# Patient Record
Sex: Male | Born: 1950 | Race: White | Hispanic: No | Marital: Married | Smoking: Current every day smoker
Health system: Southern US, Community
[De-identification: ages and names within clinical notes are randomized; demographics above are authoritative.]

## PROBLEM LIST (undated history)

## (undated) DIAGNOSIS — I10 Essential (primary) hypertension: Secondary | ICD-10-CM

---

## 2019-04-13 ENCOUNTER — Other Ambulatory Visit: Payer: Self-pay

## 2019-04-13 DIAGNOSIS — Z20822 Contact with and (suspected) exposure to covid-19: Secondary | ICD-10-CM

## 2019-04-17 LAB — NOVEL CORONAVIRUS, NAA: SARS-CoV-2, NAA: NOT DETECTED

## 2020-04-06 ENCOUNTER — Other Ambulatory Visit: Payer: Self-pay

## 2020-04-06 ENCOUNTER — Inpatient Hospital Stay: Payer: Medicare Other

## 2020-04-06 ENCOUNTER — Inpatient Hospital Stay
Admission: EM | Admit: 2020-04-06 | Discharge: 2020-04-15 | DRG: 207 | Disposition: A | Discharge status: Skilled Nursing Facility | Payer: Medicare Other | Attending: Internal Medicine | Admitting: Internal Medicine

## 2020-04-06 ENCOUNTER — Emergency Department: Payer: Medicare Other

## 2020-04-06 DIAGNOSIS — R29705 NIHSS score 5: Secondary | ICD-10-CM | POA: Diagnosis not present

## 2020-04-06 DIAGNOSIS — N179 Acute kidney failure, unspecified: Secondary | ICD-10-CM | POA: Diagnosis present

## 2020-04-06 DIAGNOSIS — R4182 Altered mental status, unspecified: Secondary | ICD-10-CM | POA: Diagnosis present

## 2020-04-06 DIAGNOSIS — R4789 Other speech disturbances: Secondary | ICD-10-CM | POA: Diagnosis present

## 2020-04-06 DIAGNOSIS — J441 Chronic obstructive pulmonary disease with (acute) exacerbation: Secondary | ICD-10-CM | POA: Diagnosis present

## 2020-04-06 DIAGNOSIS — F17210 Nicotine dependence, cigarettes, uncomplicated: Secondary | ICD-10-CM | POA: Diagnosis present

## 2020-04-06 DIAGNOSIS — Z20822 Contact with and (suspected) exposure to covid-19: Secondary | ICD-10-CM | POA: Diagnosis present

## 2020-04-06 DIAGNOSIS — E872 Acidosis: Secondary | ICD-10-CM | POA: Diagnosis present

## 2020-04-06 DIAGNOSIS — I214 Non-ST elevation (NSTEMI) myocardial infarction: Secondary | ICD-10-CM | POA: Diagnosis present

## 2020-04-06 DIAGNOSIS — R29738 NIHSS score 38: Secondary | ICD-10-CM | POA: Diagnosis not present

## 2020-04-06 DIAGNOSIS — R29731 NIHSS score 31: Secondary | ICD-10-CM | POA: Diagnosis not present

## 2020-04-06 DIAGNOSIS — I10 Essential (primary) hypertension: Secondary | ICD-10-CM

## 2020-04-06 DIAGNOSIS — T502X5A Adverse effect of carbonic-anhydrase inhibitors, benzothiadiazides and other diuretics, initial encounter: Secondary | ICD-10-CM | POA: Diagnosis present

## 2020-04-06 DIAGNOSIS — I5043 Acute on chronic combined systolic (congestive) and diastolic (congestive) heart failure: Secondary | ICD-10-CM | POA: Diagnosis present

## 2020-04-06 DIAGNOSIS — I5031 Acute diastolic (congestive) heart failure: Secondary | ICD-10-CM | POA: Diagnosis not present

## 2020-04-06 DIAGNOSIS — J9600 Acute respiratory failure, unspecified whether with hypoxia or hypercapnia: Secondary | ICD-10-CM

## 2020-04-06 DIAGNOSIS — J9601 Acute respiratory failure with hypoxia: Principal | ICD-10-CM

## 2020-04-06 DIAGNOSIS — E785 Hyperlipidemia, unspecified: Secondary | ICD-10-CM | POA: Diagnosis present

## 2020-04-06 DIAGNOSIS — I739 Peripheral vascular disease, unspecified: Secondary | ICD-10-CM | POA: Diagnosis present

## 2020-04-06 DIAGNOSIS — I5021 Acute systolic (congestive) heart failure: Secondary | ICD-10-CM | POA: Diagnosis not present

## 2020-04-06 DIAGNOSIS — Z66 Do not resuscitate: Secondary | ICD-10-CM | POA: Diagnosis not present

## 2020-04-06 DIAGNOSIS — R609 Edema, unspecified: Secondary | ICD-10-CM

## 2020-04-06 DIAGNOSIS — I959 Hypotension, unspecified: Secondary | ICD-10-CM | POA: Diagnosis present

## 2020-04-06 DIAGNOSIS — I509 Heart failure, unspecified: Secondary | ICD-10-CM | POA: Diagnosis not present

## 2020-04-06 DIAGNOSIS — I110 Hypertensive heart disease with heart failure: Secondary | ICD-10-CM | POA: Diagnosis present

## 2020-04-06 DIAGNOSIS — I639 Cerebral infarction, unspecified: Secondary | ICD-10-CM | POA: Diagnosis present

## 2020-04-06 DIAGNOSIS — R479 Unspecified speech disturbances: Secondary | ICD-10-CM | POA: Diagnosis not present

## 2020-04-06 DIAGNOSIS — J810 Acute pulmonary edema: Secondary | ICD-10-CM | POA: Diagnosis not present

## 2020-04-06 DIAGNOSIS — E669 Obesity, unspecified: Secondary | ICD-10-CM | POA: Diagnosis present

## 2020-04-06 DIAGNOSIS — R29700 NIHSS score 0: Secondary | ICD-10-CM | POA: Diagnosis present

## 2020-04-06 DIAGNOSIS — F17200 Nicotine dependence, unspecified, uncomplicated: Secondary | ICD-10-CM

## 2020-04-06 DIAGNOSIS — G9341 Metabolic encephalopathy: Secondary | ICD-10-CM | POA: Diagnosis present

## 2020-04-06 DIAGNOSIS — Z6829 Body mass index (BMI) 29.0-29.9, adult: Secondary | ICD-10-CM | POA: Diagnosis not present

## 2020-04-06 DIAGNOSIS — R29717 NIHSS score 17: Secondary | ICD-10-CM | POA: Diagnosis not present

## 2020-04-06 DIAGNOSIS — J9602 Acute respiratory failure with hypercapnia: Secondary | ICD-10-CM | POA: Diagnosis present

## 2020-04-06 DIAGNOSIS — G4733 Obstructive sleep apnea (adult) (pediatric): Secondary | ICD-10-CM | POA: Diagnosis present

## 2020-04-06 HISTORY — DX: Essential (primary) hypertension: I10

## 2020-04-06 LAB — TROPONIN I (HIGH SENSITIVITY)
Troponin I (High Sensitivity): 899 ng/L (ref <18)
Troponin I (High Sensitivity): 833 ng/L (ref <18)

## 2020-04-06 LAB — SARS CORONAVIRUS 2 BY RT PCR (HOSPITAL ORDER, PERFORMED IN ~~LOC~~ HOSPITAL LAB): SARS Coronavirus 2: NEGATIVE

## 2020-04-06 LAB — LACTIC ACID, PLASMA: Lactic Acid, Venous: 1.6 mmol/L (ref 0.5–1.9)

## 2020-04-06 LAB — CBC WITH DIFFERENTIAL/PLATELET
Abs Immature Granulocytes: 0.06 10*3/uL (ref 0.00–0.07)
Basophils Absolute: 0 10*3/uL (ref 0.0–0.1)
Basophils Relative: 0 %
Eosinophils Absolute: 0.4 10*3/uL (ref 0.0–0.5)
Eosinophils Relative: 4 %
HCT: 54.8 % — ABNORMAL HIGH (ref 39.0–52.0)
Hemoglobin: 17.3 g/dL — ABNORMAL HIGH (ref 13.0–17.0)
Immature Granulocytes: 1 %
Lymphocytes Relative: 5 %
Lymphs Abs: 0.5 10*3/uL — ABNORMAL LOW (ref 0.7–4.0)
MCH: 30 pg (ref 26.0–34.0)
MCHC: 31.6 g/dL (ref 30.0–36.0)
MCV: 95 fL (ref 80.0–100.0)
Monocytes Absolute: 1.2 10*3/uL — ABNORMAL HIGH (ref 0.1–1.0)
Monocytes Relative: 11 %
Neutro Abs: 8.4 10*3/uL — ABNORMAL HIGH (ref 1.7–7.7)
Neutrophils Relative %: 79 %
Platelets: 224 10*3/uL (ref 150–400)
RBC: 5.77 MIL/uL (ref 4.22–5.81)
RDW: 12.9 % (ref 11.5–15.5)
WBC: 10.6 10*3/uL — ABNORMAL HIGH (ref 4.0–10.5)
nRBC: 0.2 % (ref 0.0–0.2)

## 2020-04-06 LAB — BLOOD GAS, ARTERIAL
Acid-Base Excess: 0.9 mmol/L (ref 0.0–2.0)
Acid-base deficit: 1.4 mmol/L (ref 0.0–2.0)
Acid-base deficit: 1.3 mmol/L (ref 0.0–2.0)
Allens test (pass/fail): POSITIVE — AB
Bicarbonate: 30.7 mmol/L — ABNORMAL HIGH (ref 20.0–28.0)
Bicarbonate: 34.1 mmol/L — ABNORMAL HIGH (ref 20.0–28.0)
Bicarbonate: 27.6 mmol/L (ref 20.0–28.0)
Delivery systems: POSITIVE
Delivery systems: POSITIVE
Expiratory PAP: 6
Expiratory PAP: 7
FIO2: 30
FIO2: 0.6
FIO2: 0.5
Inspiratory PAP: 12
Inspiratory PAP: 18
MECHVT: 500 mL
Mechanical Rate: 20
Mechanical Rate: 24
O2 Saturation: 87.2 %
O2 Saturation: 99 %
O2 Saturation: 98.7 %
PEEP: 5 cmH2O
Patient temperature: 37
Patient temperature: 37
Patient temperature: 37
RATE: 20 resp/min
RATE: 24 resp/min
pCO2 arterial: 86 mmHg (ref 32.0–48.0)
pCO2 arterial: 98 mmHg (ref 32.0–48.0)
pCO2 arterial: 63 mmHg — ABNORMAL HIGH (ref 32.0–48.0)
pH, Arterial: 7.16 — CL (ref 7.350–7.450)
pH, Arterial: 7.15 — CL (ref 7.350–7.450)
pH, Arterial: 7.25 — ABNORMAL LOW (ref 7.350–7.450)
pO2, Arterial: 68 mmHg — ABNORMAL LOW (ref 83.0–108.0)
pO2, Arterial: 160 mmHg — ABNORMAL HIGH (ref 83.0–108.0)
pO2, Arterial: 138 mmHg — ABNORMAL HIGH (ref 83.0–108.0)

## 2020-04-06 LAB — COMPREHENSIVE METABOLIC PANEL
ALT: 132 U/L — ABNORMAL HIGH (ref 0–44)
AST: 109 U/L — ABNORMAL HIGH (ref 15–41)
Albumin: 3.7 g/dL (ref 3.5–5.0)
Alkaline Phosphatase: 80 U/L (ref 38–126)
Anion gap: 14 (ref 5–15)
BUN: 63 mg/dL — ABNORMAL HIGH (ref 8–23)
CO2: 26 mmol/L (ref 22–32)
Calcium: 8.8 mg/dL — ABNORMAL LOW (ref 8.9–10.3)
Chloride: 93 mmol/L — ABNORMAL LOW (ref 98–111)
Creatinine, Ser: 2.22 mg/dL — ABNORMAL HIGH (ref 0.61–1.24)
GFR calc Af Amer: 34 mL/min — ABNORMAL LOW (ref >60)
GFR calc non Af Amer: 29 mL/min — ABNORMAL LOW (ref >60)
Glucose, Bld: 129 mg/dL — ABNORMAL HIGH (ref 70–99)
Potassium: 4.9 mmol/L (ref 3.5–5.1)
Sodium: 133 mmol/L — ABNORMAL LOW (ref 135–145)
Total Bilirubin: 0.5 mg/dL (ref 0.3–1.2)
Total Protein: 7.4 g/dL (ref 6.5–8.1)

## 2020-04-06 LAB — BLOOD GAS, VENOUS
Acid-base deficit: 0.9 mmol/L (ref 0.0–2.0)
Bicarbonate: 32 mmol/L — ABNORMAL HIGH (ref 20.0–28.0)
O2 Saturation: 88.9 %
Patient temperature: 37
pCO2, Ven: 94 mmHg (ref 44.0–60.0)
pH, Ven: 7.14 — CL (ref 7.250–7.430)
pO2, Ven: 73 mmHg — ABNORMAL HIGH (ref 32.0–45.0)

## 2020-04-06 LAB — URINALYSIS, COMPLETE (UACMP) WITH MICROSCOPIC
Bacteria, UA: NONE SEEN
Bilirubin Urine: NEGATIVE
Glucose, UA: NEGATIVE mg/dL
Hgb urine dipstick: NEGATIVE
Ketones, ur: NEGATIVE mg/dL
Leukocytes,Ua: NEGATIVE
Nitrite: NEGATIVE
Protein, ur: NEGATIVE mg/dL
Specific Gravity, Urine: 1.008 (ref 1.005–1.030)
pH: 5 (ref 5.0–8.0)

## 2020-04-06 LAB — PROCALCITONIN: Procalcitonin: 0.62 ng/mL

## 2020-04-06 LAB — BRAIN NATRIURETIC PEPTIDE: B Natriuretic Peptide: 1085.2 pg/mL — ABNORMAL HIGH (ref 0.0–100.0)

## 2020-04-06 MED ORDER — ETOMIDATE 2 MG/ML IV SOLN: 20.0000 mg | Freq: Once | INTRAVENOUS | Status: DC

## 2020-04-06 MED ORDER — FENTANYL CITRATE (PF) 100 MCG/2ML IJ SOLN
25.0000 ug | Freq: Once | INTRAMUSCULAR | Status: AC
  Administered 2020-04-06: 25 ug via INTRAVENOUS

## 2020-04-06 MED ORDER — IPRATROPIUM-ALBUTEROL 0.5-2.5 (3) MG/3ML IN SOLN: 3.0000 mL | Freq: Once | RESPIRATORY_TRACT | Status: DC

## 2020-04-06 MED ORDER — SODIUM CHLORIDE 0.9 % IV SOLN
500.0000 mg | INTRAVENOUS | Status: AC
  Administered 2020-04-06 – 2020-04-10 (×5): 500 mg via INTRAVENOUS
  Filled 2020-04-06 (×5): qty 500

## 2020-04-06 MED ORDER — MIDAZOLAM HCL 2 MG/2ML IJ SOLN: 1.0000 mg | INTRAMUSCULAR | Status: DC | PRN

## 2020-04-06 MED ORDER — PROPOFOL 1000 MG/100ML IV EMUL: 5.0000 ug/kg/min | INTRAVENOUS | Status: DC

## 2020-04-06 MED ORDER — IPRATROPIUM-ALBUTEROL 0.5-2.5 (3) MG/3ML IN SOLN: 3.0000 mL | Freq: Four times a day (QID) | RESPIRATORY_TRACT | Status: DC | PRN

## 2020-04-06 MED ORDER — SODIUM CHLORIDE 0.9 % IV BOLUS
1000.0000 mL | Freq: Once | INTRAVENOUS | Status: AC
  Administered 2020-04-06: 1000 mL via INTRAVENOUS

## 2020-04-06 MED ORDER — ATORVASTATIN CALCIUM 20 MG PO TABS: 80.0000 mg | ORAL_TABLET | Freq: Every day | ORAL | Status: DC

## 2020-04-06 MED ORDER — FENTANYL 2500MCG IN NS 250ML (10MCG/ML) PREMIX INFUSION
25.0000 ug/h | INTRAVENOUS | Status: DC
  Administered 2020-04-06: 25 ug/h via INTRAVENOUS
  Filled 2020-04-06: qty 250

## 2020-04-06 MED ORDER — METHYLPREDNISOLONE SODIUM SUCC 125 MG IJ SOLR: 125.0000 mg | Freq: Once | INTRAMUSCULAR | Status: DC

## 2020-04-06 MED ORDER — NOREPINEPHRINE 4 MG/250ML-% IV SOLN
INTRAVENOUS | Status: AC
  Administered 2020-04-06: 5 ug/min via INTRAVENOUS
  Filled 2020-04-06: qty 250

## 2020-04-06 MED ORDER — BUDESONIDE 0.5 MG/2ML IN SUSP
0.5000 mg | Freq: Two times a day (BID) | RESPIRATORY_TRACT | Status: DC
  Administered 2020-04-07 – 2020-04-15 (×16): 0.5 mg via RESPIRATORY_TRACT
  Filled 2020-04-06 (×18): qty 2

## 2020-04-06 MED ORDER — SODIUM CHLORIDE 0.9 % IV SOLN
2.0000 g | INTRAVENOUS | Status: AC
  Administered 2020-04-06 – 2020-04-10 (×5): 2 g via INTRAVENOUS
  Filled 2020-04-06: qty 2
  Filled 2020-04-06: qty 20
  Filled 2020-04-06 (×3): qty 2

## 2020-04-06 MED ORDER — SENNOSIDES-DOCUSATE SODIUM 8.6-50 MG PO TABS: 1.0000 | ORAL_TABLET | Freq: Every evening | ORAL | Status: DC | PRN

## 2020-04-06 MED ORDER — METHYLPREDNISOLONE SODIUM SUCC 40 MG IJ SOLR
40.0000 mg | Freq: Two times a day (BID) | INTRAMUSCULAR | Status: DC
  Administered 2020-04-07 – 2020-04-08 (×3): 40 mg via INTRAVENOUS
  Filled 2020-04-06 (×3): qty 1

## 2020-04-06 MED ORDER — IPRATROPIUM-ALBUTEROL 0.5-2.5 (3) MG/3ML IN SOLN
3.0000 mL | Freq: Once | RESPIRATORY_TRACT | Status: AC
  Administered 2020-04-06: 3 mL via RESPIRATORY_TRACT

## 2020-04-06 MED ORDER — NOREPINEPHRINE 4 MG/250ML-% IV SOLN
0.0000 ug/min | INTRAVENOUS | Status: DC
  Administered 2020-04-07: 5 ug/min via INTRAVENOUS
  Administered 2020-04-07 – 2020-04-08 (×2): 10 ug/min via INTRAVENOUS
  Filled 2020-04-06 (×3): qty 250

## 2020-04-06 MED ORDER — DOCUSATE SODIUM 50 MG/5ML PO LIQD
100.0000 mg | Freq: Two times a day (BID) | ORAL | Status: DC
  Administered 2020-04-07 – 2020-04-15 (×10): 100 mg
  Filled 2020-04-06 (×17): qty 10

## 2020-04-06 MED ORDER — LABETALOL HCL 5 MG/ML IV SOLN: 10.0000 mg | INTRAVENOUS | Status: DC | PRN

## 2020-04-06 MED ORDER — DOCUSATE SODIUM 50 MG/5ML PO LIQD: 100.0000 mg | Freq: Two times a day (BID) | ORAL | Status: DC

## 2020-04-06 MED ORDER — ACETAMINOPHEN 650 MG RE SUPP: 650.0000 mg | RECTAL | Status: DC | PRN

## 2020-04-06 MED ORDER — FENTANYL CITRATE (PF) 100 MCG/2ML IJ SOLN
50.0000 ug | Freq: Once | INTRAMUSCULAR | Status: AC
  Administered 2020-04-06: 50 ug via INTRAVENOUS
  Filled 2020-04-06: qty 2

## 2020-04-06 MED ORDER — SUCCINYLCHOLINE CHLORIDE 20 MG/ML IJ SOLN
120.0000 mg | Freq: Once | INTRAMUSCULAR | Status: AC
  Administered 2020-04-06: 120 mg via INTRAVENOUS

## 2020-04-06 MED ORDER — MIDAZOLAM HCL 5 MG/5ML IJ SOLN
2.0000 mg | Freq: Once | INTRAMUSCULAR | Status: AC
  Administered 2020-04-06: 2 mg via INTRAVENOUS

## 2020-04-06 MED ORDER — PROPOFOL 1000 MG/100ML IV EMUL
INTRAVENOUS | Status: AC
  Filled 2020-04-06: qty 100

## 2020-04-06 MED ORDER — MIDAZOLAM HCL 2 MG/2ML IJ SOLN
2.0000 mg | Freq: Once | INTRAMUSCULAR | Status: AC
  Administered 2020-04-06: 2 mg via INTRAVENOUS
  Filled 2020-04-06: qty 2

## 2020-04-06 MED ORDER — FENTANYL BOLUS VIA INFUSION
25.0000 ug | INTRAVENOUS | Status: DC | PRN
  Administered 2020-04-07 – 2020-04-09 (×2): 25 ug via INTRAVENOUS
  Filled 2020-04-06: qty 25

## 2020-04-06 MED ORDER — POLYETHYLENE GLYCOL 3350 17 G PO PACK: 17.0000 g | PACK | Freq: Every day | ORAL | Status: DC

## 2020-04-06 MED ORDER — FENTANYL 2500MCG IN NS 250ML (10MCG/ML) PREMIX INFUSION
25.0000 ug/h | INTRAVENOUS | Status: DC
  Administered 2020-04-06: 25 ug/h via INTRAVENOUS
  Administered 2020-04-08 – 2020-04-09 (×2): 100 ug/h via INTRAVENOUS
  Administered 2020-04-10: 125 ug/h via INTRAVENOUS
  Filled 2020-04-06 (×4): qty 250

## 2020-04-06 MED ORDER — METHYLPREDNISOLONE SODIUM SUCC 125 MG IJ SOLR
125.0000 mg | Freq: Once | INTRAMUSCULAR | Status: AC
  Administered 2020-04-06: 125 mg via INTRAVENOUS
  Filled 2020-04-06: qty 2

## 2020-04-06 MED ORDER — IPRATROPIUM-ALBUTEROL 0.5-2.5 (3) MG/3ML IN SOLN
3.0000 mL | RESPIRATORY_TRACT | Status: DC
  Administered 2020-04-06 – 2020-04-13 (×39): 3 mL via RESPIRATORY_TRACT
  Filled 2020-04-06 (×39): qty 3

## 2020-04-06 MED ORDER — FUROSEMIDE 10 MG/ML IJ SOLN
80.0000 mg | Freq: Once | INTRAMUSCULAR | Status: AC
  Administered 2020-04-06: 80 mg via INTRAVENOUS
  Filled 2020-04-06: qty 8

## 2020-04-06 MED ORDER — MIDAZOLAM 50MG/50ML (1MG/ML) PREMIX INFUSION
1.0000 mg/h | INTRAVENOUS | Status: DC
  Administered 2020-04-06: 1 mg/h via INTRAVENOUS
  Administered 2020-04-07: 0.5 mg/h via INTRAVENOUS
  Administered 2020-04-08: 1 mg/h via INTRAVENOUS
  Administered 2020-04-09: 2 mg/h via INTRAVENOUS
  Filled 2020-04-06 (×4): qty 50

## 2020-04-06 MED ORDER — ATORVASTATIN CALCIUM 20 MG PO TABS
80.0000 mg | ORAL_TABLET | Freq: Every day | ORAL | Status: DC
  Administered 2020-04-07 – 2020-04-15 (×9): 80 mg
  Filled 2020-04-06 (×3): qty 4
  Filled 2020-04-06: qty 8
  Filled 2020-04-06 (×5): qty 4

## 2020-04-06 MED ORDER — ETOMIDATE 2 MG/ML IV SOLN
10.0000 mg | Freq: Once | INTRAVENOUS | Status: AC
  Administered 2020-04-06: 10 mg via INTRAVENOUS

## 2020-04-06 MED ORDER — IPRATROPIUM-ALBUTEROL 0.5-2.5 (3) MG/3ML IN SOLN
3.0000 mL | Freq: Once | RESPIRATORY_TRACT | Status: AC
  Administered 2020-04-06: 3 mL via RESPIRATORY_TRACT
  Filled 2020-04-06: qty 9

## 2020-04-06 MED ORDER — ACETAMINOPHEN 325 MG PO TABS: 650.0000 mg | ORAL_TABLET | ORAL | Status: DC | PRN

## 2020-04-06 MED ORDER — STROKE: EARLY STAGES OF RECOVERY BOOK: Freq: Once | Status: DC

## 2020-04-06 MED ORDER — ACETAMINOPHEN 160 MG/5ML PO SOLN
650.0000 mg | ORAL | Status: DC | PRN
  Filled 2020-04-06: qty 20.3

## 2020-04-06 MED ORDER — POLYETHYLENE GLYCOL 3350 17 G PO PACK
17.0000 g | PACK | Freq: Every day | ORAL | Status: DC
  Administered 2020-04-08 – 2020-04-15 (×6): 17 g
  Filled 2020-04-06 (×9): qty 1

## 2020-04-06 NOTE — ED Notes (Signed)
Attempted to call report, unable to take report at this time

## 2020-04-06 NOTE — ED Notes (Signed)
This RN spoke with [name] regarding change in neuro status. Pt less responsive. Not answering questions.  Per Dakota Dunn will speak with RT and adjust bipap after repeat lab results

## 2020-04-06 NOTE — ED Notes (Signed)
US at bedside

## 2020-04-06 NOTE — ED Notes (Signed)
Dr Kerman Passey notified of critical VBG results, awaiting results.

## 2020-04-06 NOTE — ED Notes (Signed)
Pt changed and peri care provided. Linens changed

## 2020-04-06 NOTE — ED Provider Notes (Signed)
[facility] Emergency Department Provider Note  ____________________________________________   First MD Initiated Contact with Patient 04/06/20 1414     (approximate)  I have reviewed the triage vital signs and the nursing notes.   HISTORY  Chief Complaint Altered Mental Status and Weakness    HPI Dakota Dunn is a 69 y.o. male with hypertension, smoking history but never been diagnosed with COPD who comes in for altered mental status.  According to family patient was more altered and weak since Tuesday.  Patient stated that he was plan to follow-up with his primary care doctor but his family called EMS.  They found patient to be satting 78% on room air was placed on 4 L of oxygen.  Patient does appear short of breath although denies actually feeling short of breath.  He does report having worsening leg swelling that started a few days ago as well.  Denies ever having this previously.  Patient also has a cough noted.  He denies any chest pain.  Patient is now alert and oriented and follows a conversation easily.  He denies any falls.  Lives at home with his wife.  He has had his vaccines for Covid          Past Medical History:  Diagnosis Date  . Hypertension     There are no problems to display for this patient.   History reviewed. No pertinent surgical history.  Prior to Admission medications   Not on File    Allergies Patient has no allergy information on record.  No family history on file.  Social History Denies daily drinking, positive smoking history   Review of Systems Constitutional: No fever/chills, confusion Eyes: No visual changes. ENT: No sore throat. Cardiovascular: No chest pain, leg swelling Respiratory: Positive for SOB, cough Gastrointestinal: No abdominal pain.  No nausea, no vomiting.  No diarrhea.  No constipation. Genitourinary: Negative for dysuria. Musculoskeletal: Negative for back pain. Skin: Negative for  rash. Neurological: Negative for headaches, focal weakness or numbness. All other ROS negative ____________________________________________   PHYSICAL EXAM:  VITAL SIGNS: ED Triage Vitals  Enc Vitals Group     BP 04/06/20 1409 102/60     Pulse Rate 04/06/20 1409 98     Resp 04/06/20 1409 (!) 25     Temp 04/06/20 1409 97.7 F (36.5 C)     Temp Source 04/06/20 1409 Oral     SpO2 04/06/20 1409 98 %     Weight 04/06/20 1406 185 lb (83.9 kg)     Height 04/06/20 1406 5\' 9"  (1.753 m)     Head Circumference --      Peak Flow --      Pain Score 04/06/20 1406 0     Pain Loc --      Pain Edu? --      Excl. in [place]? --     Constitutional: Alert and oriented. Well appearing and in no acute distress. Eyes: Conjunctivae are normal. EOMI. Head: Atraumatic. Nose: No congestion/rhinnorhea. Mouth/Throat: Mucous membranes are moist.   Neck: No stridor. Trachea Midline. FROM Cardiovascular: Normal rate, regular rhythm. Grossly normal heart sounds.  Good peripheral circulation. Respiratory: Some wheezing and coarse breath sounds bilaterally.  Mild increased work of breathing, on 4 L Gastrointestinal: Soft and nontender. No distention. No abdominal bruits.  Musculoskeletal: 1+ edema bilaterally.  No joint effusions. Neurologic:  Normal speech and language. No gross focal neurologic deficits are appreciated.  Skin:  Skin is warm, dry and intact.  No rash noted. Psychiatric: Mood and affect are normal. Speech and behavior are normal. GU: Deferred   ____________________________________________   LABS (all labs ordered are listed, but only abnormal results are displayed)  Labs Reviewed  BRAIN NATRIURETIC PEPTIDE - Abnormal; Notable for the following components:      Result Value   B Natriuretic Peptide 1,085.2 (*)    All other components within normal limits  COMPREHENSIVE METABOLIC PANEL - Abnormal; Notable for the following components:   Sodium 133 (*)    Chloride 93 (*)    Glucose, Bld  129 (*)    BUN 63 (*)    Creatinine, Ser 2.22 (*)    Calcium 8.8 (*)    AST 109 (*)    ALT 132 (*)    GFR calc non Af Amer 29 (*)    GFR calc Af Amer 34 (*)    All other components within normal limits  CBC WITH DIFFERENTIAL/PLATELET - Abnormal; Notable for the following components:   WBC 10.6 (*)    Hemoglobin 17.3 (*)    HCT 54.8 (*)    Neutro Abs 8.4 (*)    Lymphs Abs 0.5 (*)    Monocytes Absolute 1.2 (*)    All other components within normal limits  TROPONIN I (HIGH SENSITIVITY) - Abnormal; Notable for the following components:   Troponin I (High Sensitivity) 899 (*)    All other components within normal limits  SARS CORONAVIRUS 2 BY RT PCR (HOSPITAL ORDER, [place] [facility])  PROCALCITONIN  LACTIC ACID, PLASMA  URINALYSIS, COMPLETE (UACMP) WITH MICROSCOPIC   ____________________________________________   ED ECG REPORT I, Vanessa [place], the attending physician, personally viewed and interpreted this ECG.  Sinus tachycardia rate of 100, no ST elevations, T wave inversions in 2 3 aVF, normal intervals ____________________________________________  RADIOLOGY Robert Bellow, personally viewed and evaluated these images (plain radiographs) as part of my medical decision making, as well as reviewing the written report by the radiologist.  ED MD interpretation: Vascular congestion centrally  Official radiology report(s): DG Chest Port 1 View  Result Date: 04/06/2020 CLINICAL DATA:  Short of breath, bilateral lower extremity edema, productive cough EXAM: PORTABLE CHEST 1 VIEW COMPARISON:  None. FINDINGS: 2 frontal views of the chest demonstrate an unremarkable cardiac silhouette. There is central vascular congestion without airspace disease, effusion, or pneumothorax. No acute bony abnormalities. IMPRESSION: 1. Central vascular congestion without overt edema. Electronically Signed   By: Randa Ngo M.D.   On: 04/06/2020 14:55     ____________________________________________   PROCEDURES  Procedure(s) performed (including Critical Care):  .Critical Care Performed by: Vanessa Linden, MD Authorized by: Vanessa [place], MD   Critical care provider statement:    Critical care time (minutes):  35   Critical care was necessary to treat or prevent imminent or life-threatening deterioration of the following conditions:  Respiratory failure   Critical care was time spent personally by me on the following activities:  Discussions with consultants, evaluation of patient's response to treatment, examination of patient, ordering and performing treatments and interventions, ordering and review of laboratory studies, ordering and review of radiographic studies, pulse oximetry, re-evaluation of patient's condition, obtaining history from patient or surrogate and review of old charts     ____________________________________________   INITIAL IMPRESSION / [place] / ED COURSE   Dakota Dunn was evaluated in Emergency Department on 04/06/2020 for the symptoms described in the history of present illness. He was evaluated  in the context of the global COVID-19 pandemic, which necessitated consideration that the patient might be at risk for infection with the SARS-CoV-2 virus that causes COVID-19. Institutional protocols and algorithms that pertain to the evaluation of patients at risk for COVID-19 are in a state of rapid change based on information released by regulatory bodies including the CDC and federal and state organizations. These policies and algorithms were followed during the patient's care in the ED.     Pt presents with SOB.  Patient does have diffuse wheezing noted bilaterally.  Possibly secondary to COPD although no prior history of this although he does smoke.  Will get some duo nebs and steroids.  Given the edema will also get chest x-ray to evaluate for pulmonary edema and CHF   PNA-will get xray to  evaluation Anemia-CBC to evaluate ACS- will get trops Arrhythmia-Will get EKG and keep on monitor.  COVID- will get testing per algorithm. PE-lower suspicion given no risk factors and other cause more likely  Labs do show a BNP of over 1000 and given patient is leg swelling I do suspect that there is a component of pulmonary edema although the chest x-ray was not overtly positive and I suspect that this with the COPD is causing him to be more hypoxic as well..  His kidney functions elevated at this time we cannot do a CT scan.  I suspect that his kidney function will get better with some diuresis.  We will give him a dose of IV Lasix.  Given his elevated troponin I think it be safest to anticoagulate for possible PE given we are unable to do a CT PE at this time.  Will get CT head first just to make sure there is no intracranial hemorrhage given the report of some confusion with wife.  However at this time I do not think he is significantly hypercapnic because he is answered all my questions appropriately and does not seem to be significantly altered.  His procalcitonin is elevated so we will start of antibiotics to cover for the possibility of pneumonia.  Patient handed off to the oncoming team pending CT head.  After the CT head is negative patient will be started on heparin to cover for the possibility of PE and then admission to the hospital team   ____________________________________________   FINAL CLINICAL IMPRESSION(S) / ED DIAGNOSES   Final diagnoses:  Acute respiratory failure with hypoxia ([place])     MEDICATIONS GIVEN DURING THIS VISIT:  Medications  ipratropium-albuterol (DUONEB) 0.5-2.5 (3) MG/3ML nebulizer solution 3 mL (has no administration in time range)  ipratropium-albuterol (DUONEB) 0.5-2.5 (3) MG/3ML nebulizer solution 3 mL (has no administration in time range)  ipratropium-albuterol (DUONEB) 0.5-2.5 (3) MG/3ML nebulizer solution 3 mL (has no administration in time  range)  methylPREDNISolone sodium succinate (SOLU-MEDROL) 125 mg/2 mL injection 125 mg (has no administration in time range)  furosemide (LASIX) injection 80 mg (has no administration in time range)  cefTRIAXone (ROCEPHIN) 2 g in sodium chloride 0.9 % 100 mL IVPB (has no administration in time range)  azithromycin (ZITHROMAX) 500 mg in sodium chloride 0.9 % 250 mL IVPB (has no administration in time range)     ED Discharge Orders    None       Note:  This document was prepared using Dragon voice recognition software and may include unintentional dictation errors.   Vanessa Vazquez, MD 04/06/20 1534

## 2020-04-06 NOTE — ED Provider Notes (Signed)
-----------------------------------------   4:27 PM on 04/06/2020 -----------------------------------------  Patient care assumed from Dr. Nickolas Madrid.  Patient CT scan of the head shows possible acute or subacute infarct.  Given these findings I spoke to Dr. Doy Mince of neurology who recommends obtaining an MRI prior to starting heparin to ensure he does not have a large CVA which would be a contraindication to heparin administration.  We will obtain an MRI to further evaluate.  Patient is VBG is now resulted showing a pH of 7.14 with a PCO2 of 94.  Given his altered mental state and elevated PCO2 we will start the patient on BiPAP and recheck a VBG to ensure improvement.  I spoke to the hospitalist however they would like to wait until a repeat VBG is performed to ensure the patient is improving on BiPAP prior to admitting to stepdown otherwise patient may require ICU admission.  Patient's PCO2 improving.  Admit to the hospitalist.   ----------------------------------------- 9:06 PM on 04/06/2020 -----------------------------------------  Patient has had acute decompensation, less responsive, minimally responsive to sternal rub.  Repeat ABG shows increase in PCO2.  Decision made myself to intubate.  Intubation performed without issue.  I spoke to the hospitalist who is already spoken to the intensivist will be admitting to their service.  Will be starting on norepinephrine given hypotension.  I spoke to MRI they are able to do the MRI with intubation, they were unable to do so on BiPAP.  INTUBATION Performed by: Harvest Dark  Required items: required blood products, implants, devices, and special equipment available Patient identity confirmed: provided demographic data and hospital-assigned identification number Time out: Immediately prior to procedure a "time out" was called to verify the correct patient, procedure, equipment, support staff and site/side marked as required.  Indications:  Respiratory failure  Intubation method: s4 Glidescope Laryngoscopy   Preoxygenation: 100% BVM  Sedatives: 10mg  Etomidate Paralytic: 120mg  Succinylcholine  Tube Size: 7.5 cuffed  Post-procedure assessment: chest rise and ETCO2 monitor Breath sounds: equal and absent over the epigastrium Tube secured with: ETT holder Chest x-ray interpreted by radiologist and me.  Chest x-ray findings: endotracheal tube in appropriate position  Patient tolerated the procedure well with no immediate complications.      Harvest Dark, MD 04/06/20 [phone]

## 2020-04-06 NOTE — H&P (Addendum)
History and Physical    Dakota Dunn ZTI:[id number] DOB: 1951-09-20 DOA: 04/06/2020  PCP: Patient, No Pcp Per  Patient coming from: home   Chief Complaint: change in speech and stumbly per wife  HPI: Dakota Dunn is a 69 y.o. male with medical history significant of HTN,  Tobacco abuse presented with altered MS , speech changes, and stumbling gait per wife. Per wife for the past couple of days patient has been acting differently but unable to exactly tell me how.  She does report when he was trying to talk while watching TV with her, but was coming out of his mouth was different than what he meant.  No slurring of speech.  She felt this was worse today.  Also noticed him with a stumbling type gait last couple of days. Family called EMS and patient was found to be satting in 78% on room air and was placed on 4 L of oxygen.  On arrival patient appeared to be short of breath. Patient currently on BiPAP due to VG B showing CO2 of 94.  Upon questioning him he is speaking to me clearly and responding to my questions clearly.  He is unsure why he is here.  He does know he is at [facility], is oriented to time, person and place.  He does report experiencing dizziness and lightheadedness but denies any chest pain or shortness of breath.  He also reports his gait was off for the past couple of days.  No other complaints are noted. Denies previous hospitalization or diagnosis of CAD or COPD.  Had his covid vaccine.  ED Course: In the ER he was placed on BiPAP for PCO2 on VG B4 94.  Sodium 133, glucose 129, and creatinine 2.22, anion gap of 14, LFTs elevated, BNP 1085, troponin elevated at 899 with repeat at 833.  Lactic acid 1.6 procalcitonin 0.62.  WBC of 10.6 Covid negative as were drawn in the ED.  This x-ray revealing central vascular congestion without overt edema.  CT of the head reveals small focal cortical hypodensity right frontal lobe which may reflect acute or subacute cortical infarct.  No  evidence of hemorrhage.  Review of Systems: All systems reviewed and otherwise negative.    Past Medical History:  Diagnosis Date  . Hypertension     History reviewed. No pertinent surgical history.   has no history on file for tobacco use, alcohol use, and drug use.  Not on File  No family history on file.   Prior to Admission medications   Not on File    Physical Exam: Vitals:   04/06/20 1406 04/06/20 1409 04/06/20 1600  BP:  102/60 110/68  Pulse:  98 90  Resp:  (!) 25 (!) 25  Temp:  97.7 F (36.5 C)   TempSrc:  Oral   SpO2:  98% 97%  Weight: 83.9 kg    Height: _0  (1.753 m)      Constitutional: NAD, calm, comfortable Vitals:   04/06/20 1406 04/06/20 1409 04/06/20 1600  BP:  102/60 110/68  Pulse:  98 90  Resp:  (!) 25 (!) 25  Temp:  97.7 F (36.5 C)   TempSrc:  Oral   SpO2:  98% 97%  Weight: 83.9 kg    Height: _1  (1.753 m)     Gen: on bipap, wife at bedside Eyes: EOMI ENMT: on bipap  Neck: normal, supple Respiratory:decrease bs, +crackles at bases, no wheezing, increase expiratory time Cardiovascular: Regular rate and rhythm, no murmurs /  rubs / gallops. Foot cold b/l, non cyanotic, difficult to asses pedal pulses, +edema b/l.  Abdomen: no tenderness, no masses palpated.  Bowel sounds positive.  Musculoskeletal: . No joint deformity upper and lower extremities. Good ROM, no contractures. Normal muscle tone.  Skin: warm, dry Neurologic: AAxOx3. Difficult to do finger to nose 2/2 bipap. Unable to assess facial drop due to bipap. Appropriatly answers. No slurring of speech.Strength 5/5 of RLE, RUE, LLE. 4.5/5 of LUE  Psychiatric:  Normal mood and affect .    Labs on Admission: I have personally reviewed following labs and imaging studies  CBC: Recent Labs  Lab 04/06/20 1409  WBC 10.6*  NEUTROABS 8.4*  HGB 17.3*  HCT 54.8*  MCV 95.0  PLT 122   Basic Metabolic Panel: Recent Labs  Lab 04/06/20 1409  NA 133*  K 4.9  CL 93*  CO2 26   GLUCOSE 129*  BUN 63*  CREATININE 2.22*  CALCIUM 8.8*   GFR: Estimated Creatinine Clearance: 31.4 mL/min (A) (by C-G formula based on SCr of 2.22 mg/dL (H)). Liver Function Tests: Recent Labs  Lab 04/06/20 1409  AST 109*  ALT 132*  ALKPHOS 80  BILITOT 0.5  PROT 7.4  ALBUMIN 3.7   No results for input(s): LIPASE, AMYLASE in the last 168 hours. No results for input(s): AMMONIA in the last 168 hours. Coagulation Profile: No results for input(s): INR, PROTIME in the last 168 hours. Cardiac Enzymes: No results for input(s): CKTOTAL, CKMB, CKMBINDEX, TROPONINI in the last 168 hours. BNP (last 3 results) No results for input(s): PROBNP in the last 8760 hours. HbA1C: No results for input(s): HGBA1C in the last 72 hours. CBG: No results for input(s): GLUCAP in the last 168 hours. Lipid Profile: No results for input(s): CHOL, HDL, LDLCALC, TRIG, CHOLHDL, LDLDIRECT in the last 72 hours. Thyroid Function Tests: No results for input(s): TSH, T4TOTAL, FREET4, T3FREE, THYROIDAB in the last 72 hours. Anemia Panel: No results for input(s): VITAMINB12, FOLATE, FERRITIN, TIBC, IRON, RETICCTPCT in the last 72 hours. Urine analysis: No results found for: COLORURINE, APPEARANCEUR, LABSPEC, PHURINE, GLUCOSEU, HGBUR, BILIRUBINUR, KETONESUR, PROTEINUR, UROBILINOGEN, NITRITE, LEUKOCYTESUR  Radiological Exams on Admission: CT Head Wo Contrast  Result Date: 04/06/2020 CLINICAL DATA:  Increasing weakness, altered level of consciousness, difficulty speaking for several days EXAM: CT HEAD WITHOUT CONTRAST TECHNIQUE: Contiguous axial images were obtained from the base of the skull through the vertex without intravenous contrast. COMPARISON:  None. FINDINGS: Brain: There is a focal area of cortical hypodensity in the right frontal lobe, reference images 11-13 of series 3, which could reflect an area of acute or subacute infarct. No evidence of hemorrhage. Lateral ventricles and midline structures are  unremarkable. No acute extra-axial fluid collections. No mass effect. Vascular: No hyperdense vessel or unexpected calcification. Skull: Normal. Negative for fracture or focal lesion. Sinuses/Orbits: No acute finding. Other: None. IMPRESSION: 1. Small focal cortical hypodensity right frontal lobe which may reflect acute or subacute cortical infarct. 2. No evidence of hemorrhage. Electronically Signed   By: Randa Ngo M.D.   On: 04/06/2020 15:41   DG Chest Port 1 View  Result Date: 04/06/2020 CLINICAL DATA:  Short of breath, bilateral lower extremity edema, productive cough EXAM: PORTABLE CHEST 1 VIEW COMPARISON:  None. FINDINGS: 2 frontal views of the chest demonstrate an unremarkable cardiac silhouette. There is central vascular congestion without airspace disease, effusion, or pneumothorax. No acute bony abnormalities. IMPRESSION: 1. Central vascular congestion without overt edema. Electronically Signed   By: Legrand Como  Owens Shark M.D.   On: 04/06/2020 14:55    EKG: Independently reviewed. NS, pac, st inversions inferiorly.   Assessment/Plan Active Problems:   * No active hospital problems. *    1. Altered MS- likely combination of elevated CO2 found on blood gas and possible acute or subacute infarct found on CT of the head It appears he has resolved based on my exam currently as patient is responding appropriately -continue with bipap, repeat abg  -will admit to step down Unit -obtain MRI brain  2. Stroke-found on CT scan on admission , with small focal cortical hypodensity right frontal lobe which may reflect acute or subacute cortical infarct. ER attending Dr. Kerman Passey, they spoke to neurology which recommended getting MRI of brain as they initially were going to start patient on heparin drip for possible PE and the findings of CT was found. We will obtain MRI of the brain which was ordered Will consult neurology in am neurochecks FLP, with goal LDL <70 Hold off on asa until MRI  obtained and cleared by neurology PT/OT Permissive hypertension, keep sbp around 160, not more than 180 Iv labetalol if sbp >180 Ck HA1c   3.CHF- acute v.s. chronic. Unknown as pt has no diagnosis of this Volume overloaded on exam. bnp elevated. Given lasix 80iv x1 in the ER. Will reassess in a.m. and give Lasix as needed Check echocardiogram for LV function and wall motion abnormalities as patient has abnormal EKG and elevated troponin Strict I's and O's Daily weight Ck b/l venous doppler for le edema  4. Acute copd exacerbation-current smoker.  No diagnosis of COPD per patient Was given IV steroids in the ER Will place on IV steroids, duo nebs, continue BiPAP and check ABG We will admit to stepdown unit for his multiple medical problems Current BGP with CO2 of 94 Procalcitonin at 0.62, wbc mildly elevated,  given IV ceftriaxone and azithromycin in ER. Will continue for now.    5.Elevated TP- with EKG changes . Current smoker +HTN. Likely NSTEMI with his risk factors.  Will obtain echo Follow-up troponin trend Cardiology was consulted, they will see tonight May need ishemic evaluation in the near future.  6.Elevated creatinine- AKI v.s. CKD. Possibly prerenal due to CHF.  May improve with diuresis.  Will monitor renal function If does not improve with diuresis, will check renal ultrasound in a.m.  7.Elevated LFT's-etiology unclear normal.  Alk phos normal. Possibly due to congestion from CHF.  If does not improve with diuresis will obtain right upper quadrant ultrasound in a.m.  8. Hypertension- hold losartan, especially since diuresis Permissive hypertension, keep sbp around 160, not more than 180 Iv labetalol if sbp >18 Needs med rec conciliation of his losartan  9.current smoker- will need counseling when he is more stable.  DVT prophylaxis: scd  Code Status: FULL Confirmed by wife Family Communication: wife at bedside  , updated of plans Disposition Plan: home  v.s. rehab Consults called: cardiology. Will need to call neurology in am  Admission status: Inpatient as patient requires more than 2 midnight stays   Nolberto Hanlon MD [facility] Pager [phone]   If 7PM-7AM, please contact night-coverage [email or web address] Password [facility]  04/06/2020, 4:59 PM

## 2020-04-06 NOTE — ED Notes (Signed)
Per [name] increase levo to start propofol

## 2020-04-06 NOTE — ED Notes (Signed)
Dr Jari Pigg notified of troponin, awaiting new orders

## 2020-04-06 NOTE — ED Notes (Addendum)
Pt alert,. Family at bedside. Call bell within reach, stretcher locked in lowest position

## 2020-04-06 NOTE — ED Notes (Signed)
7.5 airway 24 at lip.

## 2020-04-06 NOTE — ED Notes (Signed)
Rt contacted for bipap

## 2020-04-06 NOTE — ED Notes (Signed)
Pt reaching for tube, fentanyl increased

## 2020-04-06 NOTE — ED Notes (Signed)
Dr Kerman Passey to bedside to reasses, pt not responsive to verbal stimuli

## 2020-04-06 NOTE — ED Notes (Signed)
RT at bedside for ABG  Pt responsive to loud verbal stimuli

## 2020-04-06 NOTE — Progress Notes (Signed)
    BRIEF OVERNIGHT PROGRESS REPORT  SUBJECTIVE:Patient with increased somnolence and unresponsive. Repeat ABG shows worsening hypercarbia  OBJECTIVE:On arrival to the ED, he was afebrile with blood pressure 85/61 mm Hg and pulse rate 82 beats/min. There were no focal neurological deficits; he was unresponsive requiring intubation. PCCM has been consulted for management.  ASSESSMENT/PLAN: Acute Hypoxic Hypercapnic Respiratory Failure secondary to Acute CHF? -COPD with evidence of acute exacerbation -Intubated and mechanically ventillated -Follow intermittent ABG and chest x-ray as needed -CXR Central vascular congestion without overt edema -IV Lasix as blood pressure and renal function permits; currently on Lasix  IV BID -As needed bronchodilators -Management per ICU team    -Acute Metabolic Encephalopathy due to Hypercapnia -Provide supportive care -Intubated      Rufina Falco, BSN, MSN, DNP, CCRN,FNP-C  [facility] Nurse Practitioner  [facility]

## 2020-04-06 NOTE — Consult Note (Signed)
[facility] Consultation Note  Patient ID: Dakota Dunn, MRN: [id number], DOB/AGE: Feb 12, 1951 69 y.o. Admit date: 04/06/2020   Date of Consult: 04/06/2020 Primary Physician: Patient, No Pcp Per Primary Cardiologist: None  Chief Complaint:  Chief Complaint  Patient presents with  . Altered Mental Status  . Weakness   Reason for Consult: Myocardial infarction  HPI: 69 y.o. male with significant amount of tobacco abuse in the past and COPD hypertension hyperlipidemia who has had significant disorientation and other weakness and fatigue and concerns for strokelike issues.  The patient also has had lower extremity edema shortness of breath weakness fatigue PND and orthopnea weight gain and my mild cough and congestion.  When arriving to the emergency room the patient had some issues and therefore a CAT scan was performed of the brain consistent with possible stroke.  The patient has been treated for the stroke including heparin and is still having some difficulty.  There is been no evidence of chest pain but his shortness of breath is improved with BiPAP.  Patient did have a chest x-ray showing pulmonary vascular congestion consistent with congestive heart failure with a BNP of 1082.  The patient also has an EKG showing normal sinus rhythm with anterior T wave changes consistent with myocardial infarction and a troponin of 899 consistent with non-ST elevation myocardial infarction.  The patient has chronic kidney disease with a glomerular filtration rate of 29.  The patient has had a slight improvement with intravenous Lasix.  Currently he is stable on BiPAP and has not had any chest discomfort at this time but will need further medical management  Past Medical History:  Diagnosis Date  . Hypertension       Surgical History: History reviewed. No pertinent surgical history.   Home Meds: Prior to Admission medications   Medication Sig Start Date End Date Taking? Authorizing  Provider  irbesartan (AVAPRO) 150 MG tablet Take 150 mg by mouth daily. 01/30/20  Yes [provider]    Inpatient Medications:  .  stroke: mapping our early stages of recovery book   Does not apply Once  . atorvastatin  80 mg Oral Daily   . azithromycin Stopped (04/06/20 1732)  . cefTRIAXone (ROCEPHIN)  IV Stopped (04/06/20 1630)    Allergies: No Known Allergies  Social History   Socioeconomic History  . Marital status: Married    Spouse name: Not on file  . Number of children: Not on file  . Years of education: Not on file  . Highest education level: Not on file  Occupational History  . Not on file  Tobacco Use  . Smoking status: Not on file  Substance and Sexual Activity  . Alcohol use: Not on file  . Drug use: Not on file  . Sexual activity: Not on file  Other Topics Concern  . Not on file  Social History Narrative  . Not on file   Social Determinants of Health   Financial Resource Strain:   . Difficulty of Paying Living Expenses:   Food Insecurity:   . Worried About Charity fundraiser in the Last Year:   . Arboriculturist in the Last Year:   Transportation Needs:   . Film/video editor (Medical):   Marland Kitchen Lack of Transportation (Non-Medical):   Physical Activity:   . Days of Exercise per Week:   . Minutes of Exercise per Session:   Stress:   . Feeling of Stress :   Social Connections:   .  Frequency of Communication with Friends and Family:   . Frequency of Social Gatherings with Friends and Family:   . Attends Religious Services:   . Active Member of Clubs or Organizations:   . Attends Archivist Meetings:   Marland Kitchen Marital Status:   Intimate Partner Violence:   . Fear of Current or Ex-Partner:   . Emotionally Abused:   Marland Kitchen Physically Abused:   . Sexually Abused:      No family history on file.   Review of Systems Positive for shortness of breath PND orthopnea Negative for: General:  chills, fever, night sweats or weight changes.   Cardiovascular: Positive for PND orthopnea negative for syncope dizziness  Dermatological skin lesions rashes Respiratory: Cough congestion Urologic: Frequent urination urination at night and hematuria Abdominal: negative for nausea, vomiting, diarrhea, bright red blood per rectum, melena, or hematemesis Neurologic: negative for visual changes, and/or hearing changes  All other systems reviewed and are otherwise negative except as noted above.  Labs: No results for input(s): CKTOTAL, CKMB, TROPONINI in the last 72 hours. Lab Results  Component Value Date   WBC 10.6 (H) 04/06/2020   HGB 17.3 (H) 04/06/2020   HCT 54.8 (H) 04/06/2020   MCV 95.0 04/06/2020   PLT 224 04/06/2020    Recent Labs  Lab 04/06/20 1409  NA 133*  K 4.9  CL 93*  CO2 26  BUN 63*  CREATININE 2.22*  CALCIUM 8.8*  PROT 7.4  BILITOT 0.5  ALKPHOS 80  ALT 132*  AST 109*  GLUCOSE 129*   No results found for: CHOL, HDL, LDLCALC, TRIG No results found for: DDIMER  Radiology/Studies:  CT Head Wo Contrast  Result Date: 04/06/2020 CLINICAL DATA:  Increasing weakness, altered level of consciousness, difficulty speaking for several days EXAM: CT HEAD WITHOUT CONTRAST TECHNIQUE: Contiguous axial images were obtained from the base of the skull through the vertex without intravenous contrast. COMPARISON:  None. FINDINGS: Brain: There is a focal area of cortical hypodensity in the right frontal lobe, reference images 11-13 of series 3, which could reflect an area of acute or subacute infarct. No evidence of hemorrhage. Lateral ventricles and midline structures are unremarkable. No acute extra-axial fluid collections. No mass effect. Vascular: No hyperdense vessel or unexpected calcification. Skull: Normal. Negative for fracture or focal lesion. Sinuses/Orbits: No acute finding. Other: None. IMPRESSION: 1. Small focal cortical hypodensity right frontal lobe which may reflect acute or subacute cortical infarct. 2. No evidence  of hemorrhage. Electronically Signed   By: Randa Ngo M.D.   On: 04/06/2020 15:41   US Venous Img Lower Bilateral (DVT)  Result Date: 04/06/2020 CLINICAL DATA:  Edema EXAM: BILATERAL LOWER EXTREMITY VENOUS DOPPLER ULTRASOUND TECHNIQUE: Gray-scale sonography with compression, as well as color and duplex ultrasound, were performed to evaluate the deep venous system(s) from the level of the common femoral vein through the popliteal and proximal calf veins. COMPARISON:  None. FINDINGS: VENOUS Normal compressibility of the common femoral, superficial femoral, and popliteal veins, as well as the visualized calf veins. Visualized portions of profunda femoral vein and great saphenous vein unremarkable. No filling defects to suggest DVT on grayscale or color Doppler imaging. Doppler waveforms show normal direction of venous flow, normal respiratory plasticity and response to augmentation. OTHER None. Limitations: none IMPRESSION: Negative. Electronically Signed   By: Constance Holster M.D.   On: 04/06/2020 18:57   DG Chest Port 1 View  Result Date: 04/06/2020 CLINICAL DATA:  Short of breath, bilateral lower extremity edema, productive  cough EXAM: PORTABLE CHEST 1 VIEW COMPARISON:  None. FINDINGS: 2 frontal views of the chest demonstrate an unremarkable cardiac silhouette. There is central vascular congestion without airspace disease, effusion, or pneumothorax. No acute bony abnormalities. IMPRESSION: 1. Central vascular congestion without overt edema. Electronically Signed   By: Randa Ngo M.D.   On: 04/06/2020 14:55    EKG: Normal sinus rhythm with anterior T wave inversions  Weights: Filed Weights   04/06/20 1406  Weight: 83.9 kg     Physical Exam: Blood pressure 102/63, pulse 94, temperature 97.7 F (36.5 C), temperature source Oral, resp. rate (!) 22, height 5\' 9"  (1.753 m), weight 83.9 kg, SpO2 91 %. Body mass index is 27.32 kg/m. General: Well developed, well nourished, in no acute  distress. Head eyes ears nose throat: Normocephalic, atraumatic, sclera non-icteric, no xanthomas, nares are without discharge. No apparent thyromegaly and/or mass  Lungs: Normal respiratory effort.  Diffuse wheezes, base or rales, some rhonchi.  Heart: RRR with normal S1 S2. no murmur gallop, no rub, PMI is normal size and placement, carotid upstroke normal without bruit, jugular venous pressure is normal Abdomen: Soft, non-tender, non-distended with normoactive bowel sounds. No hepatomegaly. No rebound/guarding. No obvious abdominal masses. Abdominal aorta is normal size without bruit Extremities: 1+ edema. no cyanosis, no clubbing, no ulcers  Peripheral : 2+ bilateral upper extremity pulses, 2+ bilateral femoral pulses, 2+ bilateral dorsal pedal pulse Neuro: Alert and oriented. No facial asymmetry. No focal deficit. Moves all extremities spontaneously. Musculoskeletal: Normal muscle tone without kyphosis Psych:  Responds to questions appropriately with a normal affect.    Assessment: 69 year old male with hypertension hyperlipidemia now having acute symptoms consistent with stroke congestive heart failure and non-ST elevation myocardial infarction slightly improved with treatment in the emergency room  Plan: 1.  Continue heparin aspirin for non-ST elevation myocardial infarction as well as peripheral vascular disease with possible hand stroke 2.  Continue BiPAP for hypoxia acute systolic dysfunction congestive heart failure and pulmonary edema with lower extremity edema 3.  Intravenous Lasix for pulmonary edema and acute systolic dysfunction congestive heart failure 4.  No additional medication management at this time due to concerns of hypotension which may exacerbate stroke until further evaluation and stabilization 5.  Serial ECG and enzymes to assess for extent of non-ST elevation myocardial infarction 6.  Echocardiogram to assess extent of LV systolic dysfunction contributing to  above 7.  Further treatment options after stabilization and treatment of above  Signed, Corey Skains M.D. [facility] 04/06/2020, 8:00 PM

## 2020-04-06 NOTE — ED Notes (Signed)
Per Hinton Dyer NP, no propofol, start fentanyl drip

## 2020-04-06 NOTE — ED Notes (Signed)
Dr Virgie Dad at bedside notified of co2 98

## 2020-04-06 NOTE — ED Triage Notes (Signed)
Pt to ED via [facility] from home for chief complaint AMS since Tuesday and weakness. PT 78% on RA, pt placed on 4L [place].  Pt answering questions appropriately.  New onset bilateral edema to BLE Productive cough noted

## 2020-04-06 NOTE — ED Notes (Signed)
Pt unable to provide urine sample at this time 

## 2020-04-06 NOTE — ED Notes (Signed)
Attempted to call report x2. Unable to take report at this time

## 2020-04-06 NOTE — ED Notes (Signed)
RT at bedside to obtain ABG.

## 2020-04-06 NOTE — ED Notes (Signed)
Dr Jari Pigg at bedside to assess

## 2020-04-06 NOTE — ED Notes (Signed)
Pt resting at this time. RT and family at bedside. Tolerating bipap well.

## 2020-04-07 ENCOUNTER — Inpatient Hospital Stay: Payer: Medicare Other

## 2020-04-07 ENCOUNTER — Encounter: Payer: Self-pay | Admitting: Internal Medicine

## 2020-04-07 ENCOUNTER — Inpatient Hospital Stay
Admit: 2020-04-07 | Discharge: 2020-04-07 | Disposition: A | Discharge status: Home / Self Care | Payer: Medicare Other | Attending: Internal Medicine | Admitting: Internal Medicine

## 2020-04-07 DIAGNOSIS — I639 Cerebral infarction, unspecified: Secondary | ICD-10-CM

## 2020-04-07 DIAGNOSIS — J9601 Acute respiratory failure with hypoxia: Principal | ICD-10-CM

## 2020-04-07 DIAGNOSIS — R479 Unspecified speech disturbances: Secondary | ICD-10-CM

## 2020-04-07 LAB — PROCALCITONIN: Procalcitonin: 0.29 ng/mL

## 2020-04-07 LAB — CBC WITH DIFFERENTIAL/PLATELET
Abs Immature Granulocytes: 0.04 10*3/uL (ref 0.00–0.07)
Basophils Absolute: 0 10*3/uL (ref 0.0–0.1)
Basophils Relative: 0 %
Eosinophils Absolute: 0 10*3/uL (ref 0.0–0.5)
Eosinophils Relative: 0 %
HCT: 47.8 % (ref 39.0–52.0)
Hemoglobin: 15.2 g/dL (ref 13.0–17.0)
Immature Granulocytes: 1 %
Lymphocytes Relative: 7 %
Lymphs Abs: 0.5 10*3/uL — ABNORMAL LOW (ref 0.7–4.0)
MCH: 29.6 pg (ref 26.0–34.0)
MCHC: 31.8 g/dL (ref 30.0–36.0)
MCV: 93.2 fL (ref 80.0–100.0)
Monocytes Absolute: 0.4 10*3/uL (ref 0.1–1.0)
Monocytes Relative: 6 %
Neutro Abs: 5.8 10*3/uL (ref 1.7–7.7)
Neutrophils Relative %: 86 %
Platelets: 240 10*3/uL (ref 150–400)
RBC: 5.13 MIL/uL (ref 4.22–5.81)
RDW: 12.9 % (ref 11.5–15.5)
WBC: 6.7 10*3/uL (ref 4.0–10.5)
nRBC: 0 % (ref 0.0–0.2)

## 2020-04-07 LAB — BLOOD GAS, ARTERIAL
Acid-Base Excess: 3.6 mmol/L — ABNORMAL HIGH (ref 0.0–2.0)
Bicarbonate: 29.7 mmol/L — ABNORMAL HIGH (ref 20.0–28.0)
FIO2: 40
MECHVT: 500 mL
Mechanical Rate: 24
O2 Saturation: 96.7 %
PEEP: 5 cmH2O
Patient temperature: 37
pCO2 arterial: 49 mmHg — ABNORMAL HIGH (ref 32.0–48.0)
pH, Arterial: 7.39 (ref 7.350–7.450)
pO2, Arterial: 88 mmHg (ref 83.0–108.0)

## 2020-04-07 LAB — HIV ANTIBODY (ROUTINE TESTING W REFLEX): HIV Screen 4th Generation wRfx: NONREACTIVE

## 2020-04-07 LAB — COMPREHENSIVE METABOLIC PANEL
ALT: 98 U/L — ABNORMAL HIGH (ref 0–44)
AST: 69 U/L — ABNORMAL HIGH (ref 15–41)
Albumin: 3 g/dL — ABNORMAL LOW (ref 3.5–5.0)
Alkaline Phosphatase: 64 U/L (ref 38–126)
Anion gap: 7 (ref 5–15)
BUN: 52 mg/dL — ABNORMAL HIGH (ref 8–23)
CO2: 28 mmol/L (ref 22–32)
Calcium: 7.9 mg/dL — ABNORMAL LOW (ref 8.9–10.3)
Chloride: 101 mmol/L (ref 98–111)
Creatinine, Ser: 1.31 mg/dL — ABNORMAL HIGH (ref 0.61–1.24)
GFR calc Af Amer: >60 mL/min (ref >60)
GFR calc non Af Amer: 55 mL/min — ABNORMAL LOW (ref >60)
Glucose, Bld: 140 mg/dL — ABNORMAL HIGH (ref 70–99)
Potassium: 4.3 mmol/L (ref 3.5–5.1)
Sodium: 136 mmol/L (ref 135–145)
Total Bilirubin: 0.6 mg/dL (ref 0.3–1.2)
Total Protein: 6 g/dL — ABNORMAL LOW (ref 6.5–8.1)

## 2020-04-07 LAB — LIPID PANEL
Cholesterol: 111 mg/dL (ref 0–200)
HDL: 35 mg/dL — ABNORMAL LOW (ref >40)
LDL Cholesterol: 61 mg/dL (ref 0–99)
Total CHOL/HDL Ratio: 3.2 RATIO
Triglycerides: 73 mg/dL (ref <150)
VLDL: 15 mg/dL (ref 0–40)

## 2020-04-07 LAB — HEMOGLOBIN A1C
Hgb A1c MFr Bld: 5.7 % — ABNORMAL HIGH (ref 4.8–5.6)
Mean Plasma Glucose: 117 mg/dL

## 2020-04-07 LAB — GLUCOSE, CAPILLARY
Glucose-Capillary: 121 mg/dL — ABNORMAL HIGH (ref 70–99)
Glucose-Capillary: 127 mg/dL — ABNORMAL HIGH (ref 70–99)
Glucose-Capillary: 149 mg/dL — ABNORMAL HIGH (ref 70–99)
Glucose-Capillary: 159 mg/dL — ABNORMAL HIGH (ref 70–99)
Glucose-Capillary: 165 mg/dL — ABNORMAL HIGH (ref 70–99)

## 2020-04-07 LAB — MAGNESIUM: Magnesium: 2.1 mg/dL (ref 1.7–2.4)

## 2020-04-07 LAB — PHOSPHORUS: Phosphorus: 2.2 mg/dL — ABNORMAL LOW (ref 2.5–4.6)

## 2020-04-07 LAB — MRSA PCR SCREENING: MRSA by PCR: NEGATIVE

## 2020-04-07 LAB — TROPONIN I (HIGH SENSITIVITY): Troponin I (High Sensitivity): 696 ng/L (ref <18)

## 2020-04-07 MED ORDER — ORAL CARE MOUTH RINSE
15.0000 mL | OROMUCOSAL | Status: DC
  Administered 2020-04-07 – 2020-04-11 (×36): 15 mL via OROMUCOSAL

## 2020-04-07 MED ORDER — INSULIN ASPART 100 UNIT/ML ~~LOC~~ SOLN
0.0000 [IU] | SUBCUTANEOUS | Status: DC
  Administered 2020-04-07: 2 [IU] via SUBCUTANEOUS
  Administered 2020-04-07: 1 [IU] via SUBCUTANEOUS
  Administered 2020-04-07: 2 [IU] via SUBCUTANEOUS
  Administered 2020-04-07 – 2020-04-08 (×3): 1 [IU] via SUBCUTANEOUS
  Administered 2020-04-08 (×2): 2 [IU] via SUBCUTANEOUS
  Administered 2020-04-08 – 2020-04-12 (×10): 1 [IU] via SUBCUTANEOUS
  Filled 2020-04-07 (×18): qty 1

## 2020-04-07 MED ORDER — FAMOTIDINE IN NACL 20-0.9 MG/50ML-% IV SOLN
20.0000 mg | Freq: Two times a day (BID) | INTRAVENOUS | Status: DC
  Administered 2020-04-07 – 2020-04-15 (×17): 20 mg via INTRAVENOUS
  Filled 2020-04-07 (×18): qty 50

## 2020-04-07 MED ORDER — VITAL AF 1.2 CAL PO LIQD
1000.0000 mL | ORAL | Status: DC
  Administered 2020-04-07 – 2020-04-10 (×3): 1000 mL

## 2020-04-07 MED ORDER — SODIUM PHOSPHATES 45 MMOLE/15ML IV SOLN
15.0000 mmol | Freq: Once | INTRAVENOUS | Status: AC
  Administered 2020-04-07: 15 mmol via INTRAVENOUS
  Filled 2020-04-07: qty 5

## 2020-04-07 MED ORDER — CHLORHEXIDINE GLUCONATE 0.12% ORAL RINSE (MEDLINE KIT)
15.0000 mL | Freq: Two times a day (BID) | OROMUCOSAL | Status: DC
  Administered 2020-04-07 – 2020-04-11 (×8): 15 mL via OROMUCOSAL

## 2020-04-07 MED ORDER — PRO-STAT 64 PO LIQD
30.0000 mL | Freq: Every day | ORAL | Status: DC
  Administered 2020-04-08 – 2020-04-14 (×7): 30 mL
  Filled 2020-04-07: qty 30

## 2020-04-07 MED ORDER — CHLORHEXIDINE GLUCONATE CLOTH 2 % EX PADS
6.0000 | MEDICATED_PAD | Freq: Every day | CUTANEOUS | Status: DC
  Administered 2020-04-07 – 2020-04-15 (×6): 6 via TOPICAL

## 2020-04-07 NOTE — Progress Notes (Signed)
Initial Nutrition Assessment  DOCUMENTATION CODES:   Not applicable  INTERVENTION:   Vital 1.2 @60ml /hr + Prostat 14ml daily via tube   Free water flushes 56ml q4 hours to maintain tube patency   Regimen provides 1828kcal/day, 123g/day protein and 1332ml/day free water   NUTRITION DIAGNOSIS:   Inadequate oral intake related to inability to eat (pt sedated and ventilated) as evidenced by NPO status.  GOAL:   Provide needs based on ASPEN/SCCM guidelines  MONITOR:   Vent status, Labs, Weight trends, Skin, I & O's, TF tolerance  REASON FOR ASSESSMENT:   Consult Enteral/tube feeding initiation and management  ASSESSMENT:   69 y.o. male with h/o heavy tobacco abuse, hypertension and hyperlipidemia admitted with acute stroke, acute congestive heart failure, myocardial infarction, pulmonary edema and kidney dysfunction requiring intubation for airway protection   Pt sedated and ventilated. OGT in place. Will plan to start tube feeds today. Suspect pt at low refeed risk. There is no weight history in chart to determine if any significant weight changes.   Medications reviewed and include: colace, insulin, solu-medrol, miralax, azithromycin, ceftriaxone, pepcid, fentanyl, levophed   Labs reviewed: BUN 52(H), creat 1.31(H), P 2.2(L), Mg 2.1 wnl  Patient is currently intubated on ventilator support MV: 9.8 L/min Temp (24hrs), Avg:98.5 F (36.9 C), Min:97.7 F (36.5 C), Max:99 F (37.2 C)  Propofol: none   MAP- >64mmHg  UOP- 1010ml  NUTRITION - FOCUSED PHYSICAL EXAM:    Most Recent Value  Orbital Region No depletion  Upper Arm Region No depletion  Thoracic and Lumbar Region No depletion  Buccal Region No depletion  Temple Region Mild depletion  Clavicle Bone Region Mild depletion  Clavicle and Acromion Bone Region Mild depletion  Scapular Bone Region No depletion  Dorsal Hand No depletion  Patellar Region No depletion  Anterior Thigh Region No depletion   Posterior Calf Region No depletion  Edema (RD Assessment) Mild  Hair Reviewed  Eyes Reviewed  Mouth Reviewed  Skin Reviewed  Nails Reviewed     Diet Order:   Diet Order            Diet NPO time specified  Diet effective now                EDUCATION NEEDS:   No education needs have been identified at this time  Skin:  Skin Assessment: Reviewed RN Assessment  Last BM:  pta  Height:   Ht Readings from Last 1 Encounters:  04/07/20 5\' 9"  (1.753 m)    Weight:   Wt Readings from Last 1 Encounters:  04/07/20 87.5 kg    Ideal Body Weight:  72.7 kg  BMI:  Body mass index is 28.49 kg/m.  Estimated Nutritional Needs:   Kcal:  1874kcal/day  Protein:  110-130g/day  Fluid:  1.8-2.2L/day  Koleen Distance MS, RD, LDN Please refer to [facility] for RD and/or RD on-call/weekend/after hours pager

## 2020-04-07 NOTE — Progress Notes (Signed)
[facility] Encounter Note  Patient: Dakota Dunn / Admit Date: 04/06/2020 / Date of Encounter: 04/07/2020, 12:52 PM   Subjective: Patient had significant respiratory failure overnight multifactorial in nature including stroke with neurologic abnormalities and unable to protect airway now intubated on full respiratory ventilation.  Patient did have congestive heart failure as well causing hypoxia and exacerbation as well.  This includes issues of acute congestive heart failure with elevated BNP non-ST elevation myocardial infarction with elevated troponin abnormal EKG and chest x-ray showing pulmonary edema.  Currently patient has been placed on appropriate supportive therapy with no significant improvements this a.m. Echocardiogram pending Review of Systems: Cannot assess  Objective: Telemetry: Normal sinus rhythm Physical Exam: Blood pressure (!) 107/59, pulse 68, temperature 97.7 F (36.5 C), temperature source Oral, resp. rate 20, height 5\' 9"  (1.753 m), weight 87.5 kg, SpO2 97 %. Body mass index is 28.49 kg/m. General: Well developed, well nourished,  Head: Normocephalic, atraumatic, sclera non-icteric, no xanthomas, nares are without discharge. Neck: No apparent masses Lungs: Normal respirations with few wheezes, no rhonchi, no rales , some crackles   Heart: Regular rate and rhythm, normal S1 S2, no murmur, no rub, no gallop, PMI is normal size and placement, carotid upstroke normal without bruit, jugular venous pressure normal Abdomen: Soft, , non-distended with normoactive bowel sounds. No hepatosplenomegaly. Abdominal aorta is normal size without bruit Extremities: 1+ edema, no clubbing, no cyanosis, no ulcers,  Peripheral: 2+ radial, 2+ femoral, 2+ dorsal pedal pulses Neuro: Not alert and oriented.  Does not moves all extremities spontaneously. Psych: Does not responds to questions appropriately with a normal affect.   Intake/Output Summary (Last 24  hours) at 04/07/2020 1252 Last data filed at 04/07/2020 0900 Gross per 24 hour  Intake 1822.68 ml  Output 1575 ml  Net 247.68 ml    Inpatient Medications:  .  stroke: mapping our early stages of recovery book   Does not apply Once  . atorvastatin  80 mg Per Tube Daily  . budesonide (PULMICORT) nebulizer solution  0.5 mg Nebulization BID  . Chlorhexidine Gluconate Cloth  6 each Topical Daily  . docusate  100 mg Per Tube BID  . insulin aspart  0-9 Units Subcutaneous Q4H  . ipratropium-albuterol  3 mL Nebulization Q4H  . methylPREDNISolone (SOLU-MEDROL) injection  40 mg Intravenous Q12H  . polyethylene glycol  17 g Per Tube Daily   Infusions:  . azithromycin Stopped (04/06/20 1732)  . cefTRIAXone (ROCEPHIN)  IV Stopped (04/06/20 1630)  . famotidine (PEPCID) IV Stopped (04/07/20 0403)  . fentaNYL infusion INTRAVENOUS 80 mcg/hr (04/07/20 0500)  . midazolam 0.5 mg/hr (04/07/20 1203)  . norepinephrine (LEVOPHED) Adult infusion 5 mcg/min (04/07/20 1203)  . sodium phosphate  Dextrose 5% IVPB 43 mL/hr at 04/07/20 0800    Labs: Recent Labs    04/06/20 1409 04/07/20 0419  NA 133* 136  K 4.9 4.3  CL 93* 101  CO2 26 28  GLUCOSE 129* 140*  BUN 63* 52*  CREATININE 2.22* 1.31*  CALCIUM 8.8* 7.9*  MG  --  2.1  PHOS  --  2.2*   Recent Labs    04/06/20 1409 04/07/20 0419  AST 109* 69*  ALT 132* 98*  ALKPHOS 80 64  BILITOT 0.5 0.6  PROT 7.4 6.0*  ALBUMIN 3.7 3.0*   Recent Labs    04/06/20 1409 04/07/20 0419  WBC 10.6* 6.7  NEUTROABS 8.4* 5.8  HGB 17.3* 15.2  HCT 54.8* 47.8  MCV 95.0 93.2  PLT 224 240   No results for input(s): CKTOTAL, CKMB, TROPONINI in the last 72 hours. Invalid input(s): POCBNP No results for input(s): HGBA1C in the last 72 hours.   Weights: Filed Weights   04/06/20 1406 04/07/20 0024  Weight: 83.9 kg 87.5 kg     Radiology/Studies:  CT Head Wo Contrast  Result Date: 04/06/2020 CLINICAL DATA:  Increasing weakness, altered level of  consciousness, difficulty speaking for several days EXAM: CT HEAD WITHOUT CONTRAST TECHNIQUE: Contiguous axial images were obtained from the base of the skull through the vertex without intravenous contrast. COMPARISON:  None. FINDINGS: Brain: There is a focal area of cortical hypodensity in the right frontal lobe, reference images 11-13 of series 3, which could reflect an area of acute or subacute infarct. No evidence of hemorrhage. Lateral ventricles and midline structures are unremarkable. No acute extra-axial fluid collections. No mass effect. Vascular: No hyperdense vessel or unexpected calcification. Skull: Normal. Negative for fracture or focal lesion. Sinuses/Orbits: No acute finding. Other: None. IMPRESSION: 1. Small focal cortical hypodensity right frontal lobe which may reflect acute or subacute cortical infarct. 2. No evidence of hemorrhage. Electronically Signed   By: Randa Ngo M.D.   On: 04/06/2020 15:41   US Carotid Bilateral (at [facility] and [facility] only)  Result Date: 04/06/2020 CLINICAL DATA:  Stroke.  Hypertension, history of tobacco abuse EXAM: BILATERAL CAROTID DUPLEX ULTRASOUND TECHNIQUE: Pearline Cables scale imaging, color Doppler and duplex ultrasound were performed of bilateral carotid and vertebral arteries in the neck. COMPARISON:  None. FINDINGS: Criteria: Quantification of carotid stenosis is based on velocity parameters that correlate the residual internal carotid diameter with NASCET-based stenosis levels, using the diameter of the distal internal carotid lumen as the denominator for stenosis measurement. The following velocity measurements were obtained: RIGHT ICA: 109/45 cm/sec CCA: 70/35 cm/sec SYSTOLIC ICA/CCA RATIO:  1.6 ECA: 172 cm/sec LEFT ICA: 95/41 cm/sec CCA: 00/93 cm/sec SYSTOLIC ICA/CCA RATIO:  1.7 ECA: 69 cm/sec RIGHT CAROTID ARTERY: Eccentric plaque in the bulb with only mild stenosis. Normal waveforms and color Doppler signal throughout. RIGHT VERTEBRAL ARTERY:  Normal flow  direction and waveform. LEFT CAROTID ARTERY: Eccentric plaque in the bulb and ICA origin with only mild stenosis. Normal waveforms and color Doppler signal throughout. LEFT VERTEBRAL ARTERY:  Normal flow direction and waveform. IMPRESSION: 1. Bilateral carotid bifurcation plaque resulting in less than 50% diameter ICA stenosis. 2. Antegrade bilateral vertebral arterial flow. Electronically Signed   By: Lucrezia Europe M.D.   On: 04/06/2020 21:08   US Venous Img Lower Bilateral (DVT)  Result Date: 04/06/2020 CLINICAL DATA:  Edema EXAM: BILATERAL LOWER EXTREMITY VENOUS DOPPLER ULTRASOUND TECHNIQUE: Gray-scale sonography with compression, as well as color and duplex ultrasound, were performed to evaluate the deep venous system(s) from the level of the common femoral vein through the popliteal and proximal calf veins. COMPARISON:  None. FINDINGS: VENOUS Normal compressibility of the common femoral, superficial femoral, and popliteal veins, as well as the visualized calf veins. Visualized portions of profunda femoral vein and great saphenous vein unremarkable. No filling defects to suggest DVT on grayscale or color Doppler imaging. Doppler waveforms show normal direction of venous flow, normal respiratory plasticity and response to augmentation. OTHER None. Limitations: none IMPRESSION: Negative. Electronically Signed   By: Constance Holster M.D.   On: 04/06/2020 18:57   DG Chest Portable 1 View  Result Date: 04/06/2020 CLINICAL DATA:  69 year old male intubated. EXAM: PORTABLE CHEST 1 VIEW COMPARISON:  Portable chest 1427 hours today. FINDINGS: Portable AP supine  view at 2128 hours. Intubated and enteric tube in place. Endotracheal tube tip projects between the clavicles and carina. Enteric tube terminates in the left upper quadrant, side hole at the level of the proximal gastric body. Stable lung volumes and ventilation. Mildly increased pulmonary interstitial markings are stable and nonspecific. Normal cardiac  size and mediastinal contours. Negative visible bowel gas pattern. No acute osseous abnormality identified. IMPRESSION: 1. Satisfactory ET tube and enteric tube placement. 2. Stable ventilation with mildly increased pulmonary interstitial markings. Electronically Signed   By: Genevie Ann M.D.   On: 04/06/2020 21:47   DG Chest Port 1 View  Result Date: 04/06/2020 CLINICAL DATA:  Short of breath, bilateral lower extremity edema, productive cough EXAM: PORTABLE CHEST 1 VIEW COMPARISON:  None. FINDINGS: 2 frontal views of the chest demonstrate an unremarkable cardiac silhouette. There is central vascular congestion without airspace disease, effusion, or pneumothorax. No acute bony abnormalities. IMPRESSION: 1. Central vascular congestion without overt edema. Electronically Signed   By: Randa Ngo M.D.   On: 04/06/2020 14:55     Assessment and Recommendation  69 y.o. male with heavy tobacco abuse hypertension hyperlipidemia with acute stroke causing respiratory failure due to lack of protection of airway needing intubation and protection with acute congestive heart failure myocardial infarction pulmonary edema and kidney dysfunction. 1.  Continue supportive care for acute respiratory failure due to above without restriction 2.  Heparin if able for the first 48 hours status post non-ST elevation myocardial infarction for further risk reduction of event 3.  Echocardiogram pending at this time for further evaluation of non-ST elevation myocardial infarction and extent of congestive heart failure treatment options 4.  Further treatment options after above  Signed, Serafina Royals M.D. FACC

## 2020-04-07 NOTE — Consult Note (Signed)
Requesting Physician: Mortimer Fries    Chief Complaint: AMS, difficulty with speech, gait abnormalities  I have been asked by Dr. Mortimer Fries to see this patient in consultation for acute infarct.  HPI: Dakota Dunn is an 69 y.o. male who is intubated and sedated therefore all history obtained from the chart.  Patient with medical history significant of HTN, tobacco abuse who presented with altered MS , speech changes, and stumbling gait per wife. Per wife for the past couple of days patient has been acting differently.  Was trying to talk while watching TV with her, but was coming out of his mouth was different words than what he meant.  No slurring of speech.  Was worse on yesterday.  Also noticed him with a stumbling gait for the last couple of days. Family called EMS and patient was found to be satting at 78% on room air and was placed on 4 L of oxygen.  On arrival patient appeared to be short of breath. Troponins were elevated.  Worsened breathing overnight requiring intubation.  Date last known well: 04/03/2020 Time last known well: Unable to determine tPA Given: No: Outside time window  Past Medical History:  Diagnosis Date   Hypertension     History reviewed. No pertinent surgical history.  Family history: Unable to determine due to mental status  Social History:  reports that he has been smoking cigarettes. He has a 15.00 pack-year smoking history. He has never used smokeless tobacco. He reports previous alcohol use. He reports that he does not use drugs.  Allergies: No Known Allergies  Medications:  I have reviewed the patient's current medications. Prior to Admission:  Medications Prior to Admission  Medication Sig Dispense Refill Last Dose   irbesartan (AVAPRO) 150 MG tablet Take 150 mg by mouth daily.      Scheduled:   stroke: mapping our early stages of recovery book   Does not apply Once   atorvastatin  80 mg Per Tube Daily   budesonide (PULMICORT) nebulizer solution  0.5  mg Nebulization BID   Chlorhexidine Gluconate Cloth  6 each Topical Daily   docusate  100 mg Per Tube BID   insulin aspart  0-9 Units Subcutaneous Q4H   ipratropium-albuterol  3 mL Nebulization Q4H   methylPREDNISolone (SOLU-MEDROL) injection  40 mg Intravenous Q12H   polyethylene glycol  17 g Per Tube Daily    ROS: Unable to obtain due to mental status  Physical Examination: Blood pressure (!) 107/59, pulse 68, temperature 97.7 F (36.5 C), temperature source Oral, resp. rate 20, height 5\' 9"  (1.753 m), weight 87.5 kg, SpO2 97 %.  HEENT-  Normocephalic, no lesions, without obvious abnormality.  Normal external eye and conjunctiva.  Normal TM's bilaterally.  Normal auditory canals and external ears. Normal external nose, mucus membranes and septum.  Normal pharynx. Cardiovascular- S1, S2 normal, pulses palpable throughout   Lungs- decreased breath sounds Abdomen- soft, non-tender; bowel sounds normal; no masses,  no organomegaly Extremities- LE edema Lymph-no adenopathy palpable Musculoskeletal-no joint tenderness, deformity or swelling Skin-warm and dry, no hyperpigmentation, vitiligo, or suspicious lesions  Neurological Examination   Mental Status: Patient does not respond to verbal stimuli.  Attempts to localize to pain with both upper extremities to deep sternal rub.  Does not follow commands.  No verbalizations noted.  Cranial Nerves: II: patient does not respond confrontation bilaterally, pupils right 3 mm, left 3 mm,and reactive bilaterally III,IV,VI: Oculocephalic response absent bilaterally.  V,VII: corneal reflex present bilaterally  VIII: patient does  not respond to verbal stimuli IX,X: gag reflex reduced, XI: trapezius strength unable to test bilaterally XII: tongue strength unable to test Motor: Moves all extremities with noxious stimuli with no focal weakness appreciated Sensory: Responds to noxious stimuli in all extremities. Deep Tendon Reflexes:  Absent  throughout. Plantars: Mute bilaterally Cerebellar: Unable to perform  Laboratory Studies:  Basic Metabolic Panel: Recent Labs  Lab 04/06/20 1409 04/07/20 0419  NA 133* 136  K 4.9 4.3  CL 93* 101  CO2 26 28  GLUCOSE 129* 140*  BUN 63* 52*  CREATININE 2.22* 1.31*  CALCIUM 8.8* 7.9*  MG  --  2.1  PHOS  --  2.2*    Liver Function Tests: Recent Labs  Lab 04/06/20 1409 04/07/20 0419  AST 109* 69*  ALT 132* 98*  ALKPHOS 80 64  BILITOT 0.5 0.6  PROT 7.4 6.0*  ALBUMIN 3.7 3.0*   No results for input(s): LIPASE, AMYLASE in the last 168 hours. No results for input(s): AMMONIA in the last 168 hours.  CBC: Recent Labs  Lab 04/06/20 1409 04/07/20 0419  WBC 10.6* 6.7  NEUTROABS 8.4* 5.8  HGB 17.3* 15.2  HCT 54.8* 47.8  MCV 95.0 93.2  PLT 224 240    Cardiac Enzymes: No results for input(s): CKTOTAL, CKMB, CKMBINDEX, TROPONINI in the last 168 hours.  BNP: Invalid input(s): POCBNP  CBG: Recent Labs  Lab 04/07/20 0002 04/07/20 0724 04/07/20 1116  GLUCAP 121* 127* 149*    Microbiology: Results for orders placed or performed during the hospital encounter of 04/06/20  SARS Coronavirus 2 by RT PCR (hospital order, performed in [facility] hospital lab) Nasopharyngeal Nasopharyngeal Swab     Status: None   Collection Time: 04/06/20  2:09 PM   Specimen: Nasopharyngeal Swab  Result Value Ref Range Status   SARS Coronavirus 2 NEGATIVE NEGATIVE Final    Comment: (NOTE) SARS-CoV-2 target nucleic acids are NOT DETECTED.  The SARS-CoV-2 RNA is generally detectable in upper and lower respiratory specimens during the acute phase of infection. The lowest concentration of SARS-CoV-2 viral copies this assay can detect is 250 copies / mL. A negative result does not preclude SARS-CoV-2 infection and should not be used as the sole basis for treatment or other patient management decisions.  A negative result may occur with improper specimen collection / handling, submission  of specimen other than nasopharyngeal swab, presence of viral mutation(s) within the areas targeted by this assay, and inadequate number of viral copies (<250 copies / mL). A negative result must be combined with clinical observations, patient history, and epidemiological information.  Fact Sheet for Patients:   StrictlyIdeas.no  Fact Sheet for Healthcare Providers: BankingDealers.co.za  This test is not yet approved or  cleared by the [place] FDA and has been authorized for detection and/or diagnosis of SARS-CoV-2 by FDA under an Emergency Use Authorization (EUA).  This EUA will remain in effect (meaning this test can be used) for the duration of the COVID-19 declaration under Section 564(b)(1) of the Act, 21 U.S.C. section 360bbb-3(b)(1), unless the authorization is terminated or revoked sooner.  Performed at [facility], [address] [place], [place] [address]   Blood culture (routine x 2)     Status: None (Preliminary result)   Collection Time: 04/06/20  3:32 PM   Specimen: BLOOD  Result Value Ref Range Status   Specimen Description BLOOD RIGHT ANTECUBITAL  Final   Special Requests   Final    BOTTLES DRAWN AEROBIC AND ANAEROBIC Blood  Culture adequate volume   Culture   Final    NO GROWTH < 24 HOURS Performed at [facility], [address] [place], [place] [address]    Report Status PENDING  Incomplete  Blood culture (routine x 2)     Status: None (Preliminary result)   Collection Time: 04/06/20  3:37 PM   Specimen: BLOOD  Result Value Ref Range Status   Specimen Description BLOOD LEFT ANTECUBITAL  Final   Special Requests   Final    BOTTLES DRAWN AEROBIC AND ANAEROBIC Blood Culture adequate volume   Culture   Final    NO GROWTH < 24 HOURS Performed at [facility], [address] [place], [place] [address]    Report Status PENDING  Incomplete  MRSA PCR Screening     Status: None    Collection Time: 04/07/20 12:43 AM   Specimen: Nasal Mucosa; Nasopharyngeal  Result Value Ref Range Status   MRSA by PCR NEGATIVE NEGATIVE Final    Comment:        The GeneXpert MRSA Assay (FDA approved for NASAL specimens only), is one component of a comprehensive MRSA colonization surveillance program. It is not intended to diagnose MRSA infection nor to guide or monitor treatment for MRSA infections. Performed at [facility], [address] [place],  [address]     Coagulation Studies: No results for input(s): LABPROT, INR in the last 72 hours.  Urinalysis:  Recent Labs  Lab 04/06/20 1546  COLORURINE YELLOW*  LABSPEC 1.008  PHURINE 5.0  GLUCOSEU NEGATIVE  HGBUR NEGATIVE  BILIRUBINUR NEGATIVE  KETONESUR NEGATIVE  PROTEINUR NEGATIVE  NITRITE NEGATIVE  LEUKOCYTESUR NEGATIVE    Lipid Panel:    Component Value Date/Time   CHOL 111 04/07/2020 0419   TRIG 73 04/07/2020 0419   HDL 35 (L) 04/07/2020 0419   CHOLHDL 3.2 04/07/2020 0419   VLDL 15 04/07/2020 0419   LDLCALC 61 04/07/2020 0419    HgbA1C: No results found for: HGBA1C  Urine Drug Screen:  No results found for: LABOPIA, COCAINSCRNUR, LABBENZ, AMPHETMU, THCU, LABBARB  Alcohol Level: No results for input(s): ETH in the last 168 hours.  Other results: EKG: sinus tachycardia at 100 bpm.  Imaging: CT Head Wo Contrast  Result Date: 04/06/2020 CLINICAL DATA:  Increasing weakness, altered level of consciousness, difficulty speaking for several days EXAM: CT HEAD WITHOUT CONTRAST TECHNIQUE: Contiguous axial images were obtained from the base of the skull through the vertex without intravenous contrast. COMPARISON:  None. FINDINGS: Brain: There is a focal area of cortical hypodensity in the right frontal lobe, reference images 11-13 of series 3, which could reflect an area of acute or subacute infarct. No evidence of hemorrhage. Lateral ventricles and midline structures are unremarkable. No  acute extra-axial fluid collections. No mass effect. Vascular: No hyperdense vessel or unexpected calcification. Skull: Normal. Negative for fracture or focal lesion. Sinuses/Orbits: No acute finding. Other: None. IMPRESSION: 1. Small focal cortical hypodensity right frontal lobe which may reflect acute or subacute cortical infarct. 2. No evidence of hemorrhage. Electronically Signed   By: Randa Ngo M.D.   On: 04/06/2020 15:41   MR BRAIN WO CONTRAST  Result Date: 04/07/2020 CLINICAL DATA:  Altered mental status. Worsening weakness and speech disturbance. EXAM: MRI HEAD WITHOUT CONTRAST TECHNIQUE: Multiplanar, multiecho pulse sequences of the brain and surrounding structures were obtained without intravenous contrast. COMPARISON:  Head CT yesterday. FINDINGS: Brain: Diffusion imaging does not show any acute or subacute infarction. Brainstem and cerebellum are  normal. Cerebral hemispheres show mild small vessel change the white matter. No cortical or large vessel territory infarction. No mass lesion, hemorrhage, hydrocephalus or extra-axial collection. There is cerebellar tonsillar extension through the foramen magnum of 1.3 Cm consistent with chronic Chiari malformation. No sign of upper cervical cord syrinx. Vascular: Major vessels at the base of the brain show flow. Skull and upper cervical spine: Negative Sinuses/Orbits: Clear/normal Other: None IMPRESSION: No acute finding by MRI. 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 Mild chronic small-vessel change of the cerebral hemispheric white matter, often seen at this age. Electronically Signed   By: Nelson Chimes M.D.   On: 04/07/2020 14:31   US Carotid Bilateral (at [facility] and AP only)  Result Date: 04/06/2020 CLINICAL DATA:  Stroke.  Hypertension, history of tobacco abuse EXAM: BILATERAL CAROTID DUPLEX ULTRASOUND TECHNIQUE: Pearline Cables scale imaging, color Doppler and duplex ultrasound were performed of  bilateral carotid and vertebral arteries in the neck. COMPARISON:  None. FINDINGS: Criteria: Quantification of carotid stenosis is based on velocity parameters that correlate the residual internal carotid diameter with NASCET-based stenosis levels, using the diameter of the distal internal carotid lumen as the denominator for stenosis measurement. The following velocity measurements were obtained: RIGHT ICA: 109/45 cm/sec CCA: 19/62 cm/sec SYSTOLIC ICA/CCA RATIO:  1.6 ECA: 172 cm/sec LEFT ICA: 95/41 cm/sec CCA: 22/97 cm/sec SYSTOLIC ICA/CCA RATIO:  1.7 ECA: 69 cm/sec RIGHT CAROTID ARTERY: Eccentric plaque in the bulb with only mild stenosis. Normal waveforms and color Doppler signal throughout. RIGHT VERTEBRAL ARTERY:  Normal flow direction and waveform. LEFT CAROTID ARTERY: Eccentric plaque in the bulb and ICA origin with only mild stenosis. Normal waveforms and color Doppler signal throughout. LEFT VERTEBRAL ARTERY:  Normal flow direction and waveform. IMPRESSION: 1. Bilateral carotid bifurcation plaque resulting in less than 50% diameter ICA stenosis. 2. Antegrade bilateral vertebral arterial flow. Electronically Signed   By: Lucrezia Europe M.D.   On: 04/06/2020 21:08   US Venous Img Lower Bilateral (DVT)  Result Date: 04/06/2020 CLINICAL DATA:  Edema EXAM: BILATERAL LOWER EXTREMITY VENOUS DOPPLER ULTRASOUND TECHNIQUE: Gray-scale sonography with compression, as well as color and duplex ultrasound, were performed to evaluate the deep venous system(s) from the level of the common femoral vein through the popliteal and proximal calf veins. COMPARISON:  None. FINDINGS: VENOUS Normal compressibility of the common femoral, superficial femoral, and popliteal veins, as well as the visualized calf veins. Visualized portions of profunda femoral vein and great saphenous vein unremarkable. No filling defects to suggest DVT on grayscale or color Doppler imaging. Doppler waveforms show normal direction of venous flow, normal  respiratory plasticity and response to augmentation. OTHER None. Limitations: none IMPRESSION: Negative. Electronically Signed   By: Constance Holster M.D.   On: 04/06/2020 18:57   DG Chest Portable 1 View  Result Date: 04/06/2020 CLINICAL DATA:  69 year old male intubated. EXAM: PORTABLE CHEST 1 VIEW COMPARISON:  Portable chest 1427 hours today. FINDINGS: Portable AP supine view at 2128 hours. Intubated and enteric tube in place. Endotracheal tube tip projects between the clavicles and carina. Enteric tube terminates in the left upper quadrant, side hole at the level of the proximal gastric body. Stable lung volumes and ventilation. Mildly increased pulmonary interstitial markings are stable and nonspecific. Normal cardiac size and mediastinal contours. Negative visible bowel gas pattern. No acute osseous abnormality identified. IMPRESSION: 1. Satisfactory ET tube and enteric tube placement. 2. Stable ventilation with mildly increased pulmonary interstitial markings. Electronically Signed  By: Genevie Ann M.D.   On: 04/06/2020 21:47   DG Chest Port 1 View  Result Date: 04/06/2020 CLINICAL DATA:  Short of breath, bilateral lower extremity edema, productive cough EXAM: PORTABLE CHEST 1 VIEW COMPARISON:  None. FINDINGS: 2 frontal views of the chest demonstrate an unremarkable cardiac silhouette. There is central vascular congestion without airspace disease, effusion, or pneumothorax. No acute bony abnormalities. IMPRESSION: 1. Central vascular congestion without overt edema. Electronically Signed   By: Randa Ngo M.D.   On: 04/06/2020 14:55    Assessment: 69 y.o. male with medical history significant of HTN, tobacco abuse who presented with altered MS , speech changes, and stumbling gait per wife for the past couple of days.  Family called EMS and patient was found to be satting at 78% on room air and was placed on 4 L of oxygen.  On arrival patient appeared to be short of breath. Troponins were elevated.   Worsened breathing overnight requiring intubation.  Head CT personally reviewed and shows small area of hypodensity in the right frontal lobe which may represent an area of acute infarction.  MRI of the brain personally reviewed and shows no acute changes.  Chiari malformation noted which is incidental.  Symptoms likely related to pulmonary disease.    Stroke Risk Factors - hypertension and smoking  Plan: 1. Agree with continued management of pulmonary disease.  No further neurologic intervention is recommended at this time.  If further questions arise, please call or page at that time.  Thank you for allowing neurology to participate in the care of this patient.   Alexis Goodell, MD Neurology [phone] 04/07/2020, 2:38 PM

## 2020-04-07 NOTE — Progress Notes (Signed)
Later entering, ptvwas admitted for ED to ICU after midnight, pt is vented, he got agitated and pulling on tubes and lines, pt withdraws strongly to all four extremities. Pt has good gage reflex when suction, pupils are equal and reactive to light. BP was soft and cont on levo gtt. Pt has family at bedside and was updated.

## 2020-04-07 NOTE — Progress Notes (Addendum)
SLP Cancellation Note  Patient Details Name: Rielly Corlett MRN: [id number] DOB: [birth year]   Cancelled treatment:       Reason Eval/Treat Not Completed: Medical issues which prohibited therapy   Pt currently intubated. Please re-consult when pt is appropriate.    Kaylyn Garrow B. Rutherford Nail M.S., CCC-SLP, [place] Office [phone]    Stormy Fabian 04/07/2020, 8:23 AM

## 2020-04-07 NOTE — Progress Notes (Signed)
Burke made introductory visit, checking in with two visitors sitting at bedside. Family/visitors do not seem to want chaplain support at this time; [facility] remains available as needed.    04/07/20 1600  Clinical Encounter Type  Visited With Family;Patient not available  Visit Type Initial

## 2020-04-07 NOTE — Progress Notes (Signed)
*  PRELIMINARY RESULTS* Echocardiogram 2D Echocardiogram has been performed.  Dakota Dunn 04/07/2020, 10:18 AM

## 2020-04-07 NOTE — Progress Notes (Signed)
Pt went to MRI and back to CCU while on the vent.

## 2020-04-07 NOTE — Progress Notes (Signed)
The Clinical status was relayed to family in detail.  Updated and notified of patients medical condition.  Patient remains unresponsive and will not open eyes to command.     patient with increased WOB and using accessory muscles to breathe  Explained to family course of therapy and the modalities     Patient with Progressive multiorgan failure with probable very low chance of meaningful recovery despite all aggressive and optimal medical therapy.   Family understands the situation.  They have consented and agreed to DNR/DNI.   Family are satisfied with Plan of action and management. All questions answered  Additional CC time 32 mins   Destiney Sanabia Patricia Pesa, M.D.  [facility]  Medical Director [place] Director [facility] Cardio-Pulmonary Department

## 2020-04-07 NOTE — Plan of Care (Signed)
Pt continued on full vent support, 40/5/20/500,  Pt is weaned off versed gtt since 0500, pt withdraws to pain. Pt cont on Levo gtt and fentanyl gtt for now. Adequate urine out since admitted to ICU. a

## 2020-04-07 NOTE — Progress Notes (Signed)
eLink Physician-Brief Progress Note Patient Name: Dakota Dunn DOB: November 25, 1950 MRN: [id number]   Date of Service  04/07/2020  HPI/Events of Note  Patient admitted with acute hypercapnic, hypoxemic respiratory failure associated with altered mental status. Patient was intubated in the ED and is on the ventilator.  eICU Interventions  New Patient Evaluation completed.        Kerry Kass Isabell Bonafede 04/07/2020, 12:32 AM

## 2020-04-07 NOTE — Consult Note (Addendum)
Name: Dakota Dunn MRN: [id number] DOB: 20-Sep-1951    ADMISSION DATE:  04/06/2020 CONSULTATION DATE: 04/06/2020  REFERRING MD : Rufina Falco, NP  CHIEF COMPLAINT: Respiratory Failure   BRIEF PATIENT DESCRIPTION:  69 yo male admitted with acute vs. subacute cortical infarct, elevated troponin's, and acute hypercapnic respiratory failure secondary to vascular congestion and suspected undiagnosed COPD exacerbation requiring mechanical intubation   SIGNIFICANT EVENTS/STUDIES:  07/11: Pt presented to [facility] ER with AMS and acute respiratory failure  07/11: CT Head revealed small focal cortical hypodensity right frontal lobe which may reflect acute or subacute cortical infarct. No evidence of hemorrhage. Neurology consulted recommended MR Brain 07/11: US Venous BLE negative for DVT 07/11: Pt initially admitted to the stepdown unit, however while awaiting for stepdown bed availability he decompensated secondary to respiratory acidosis requiring mechanical intubation and transfer to ICU    HISTORY OF PRESENT ILLNESS:   This is a 69 yo male with a PMH of HTN and Tobacco Abuse.  He presented to [facility] ER on 07/11 via EMS with altered mental status and weakness onset of symptoms 04/01/20.  When EMS arrived at pts home he was hypoxic with O2 sats _0 % on RA requiring 4L O2 via nasal canula.  Upon arrival to the ER pt alert and oriented, but did appear short of breath with diffuse wheezing per ER notes.  Lab results revealed Na+ 133, glucose 129, BUN 63, creatinine 2.22, AST 109, ALT 132, BNP 1,085.2, troponin 899, pct 0.62, wbc 10.6, COVID-19 negative, UA negative, and vbg pH 7.14/pCO2 94/bicarb 32.   Bilateral venous US negative for DVT, however CXR concerning for vascular congestion.  CT Head concerning for possible acute vs subacute cortical infarct.  Pt received 125 mg iv solumedrol, 80 mg of iv lasix, duoneb x 3 doses, and required continuous Bipap.  Pt was subsequently admitted to the stepdown unit  per hospitalist team, however while awaiting bed availability in the ER pt decompensated.  He became minimally responsive secondary to severe respiratory acidosis requiring mechanical intubation and ICU transfer. PCCM team contacted to assume care.    PAST MEDICAL HISTORY :   has a past medical history of Hypertension.  has no past surgical history on file. Prior to Admission medications   Medication Sig Start Date End Date Taking? Authorizing Provider  irbesartan (AVAPRO) 150 MG tablet Take 150 mg by mouth daily. 01/30/20  Yes [provider]   No Known Allergies  FAMILY HISTORY:  family history is not on file. SOCIAL HISTORY:    REVIEW OF SYSTEMS:   Unable to assess pt mechanically intubated   SUBJECTIVE:  Unable to assess pt mechanically intubated   VITAL SIGNS: Temp:  [97.7 F (36.5 C)] 97.7 F (36.5 C) (07/11 1409) Pulse Rate:  [77-98] 80 (07/11 2335) Resp:  [15-32] 21 (07/11 2335) BP: (67-122)/(43-70) 101/63 (07/11 2335) SpO2:  [91 %-100 %] 98 % (07/11 2335) FiO2 (%):  [60 %] 60 % (07/11 2110) Weight:  [83.9 kg] 83.9 kg (07/11 1406)  PHYSICAL EXAMINATION: General: acutely ill appearing male, NAD mechanically intubated  Neuro: sedated, purposeful movement present, withdraws from stimulation, PERRL HEENT: supple, no JVD  Cardiovascular: nsr with depressed T wave, no R/G  Lungs: diffuse wheezes throughout, even, non labored  Abdomen: +BS x4, obese, soft, non tender, non distended  Musculoskeletal: 2+ bilateral lower extremity edema  Skin: intact no rashes or lesions present   Recent Labs  Lab 04/06/20 1409  NA 133*  K 4.9  CL 93*  CO2 26  BUN 63*  CREATININE 2.22*  GLUCOSE 129*   Recent Labs  Lab 04/06/20 1409  HGB 17.3*  HCT 54.8*  WBC 10.6*  PLT 224   CT Head Wo Contrast  Result Date: 04/06/2020 CLINICAL DATA:  Increasing weakness, altered level of consciousness, difficulty speaking for several days EXAM: CT HEAD WITHOUT CONTRAST TECHNIQUE:  Contiguous axial images were obtained from the base of the skull through the vertex without intravenous contrast. COMPARISON:  None. FINDINGS: Brain: There is a focal area of cortical hypodensity in the right frontal lobe, reference images 11-13 of series 3, which could reflect an area of acute or subacute infarct. No evidence of hemorrhage. Lateral ventricles and midline structures are unremarkable. No acute extra-axial fluid collections. No mass effect. Vascular: No hyperdense vessel or unexpected calcification. Skull: Normal. Negative for fracture or focal lesion. Sinuses/Orbits: No acute finding. Other: None. IMPRESSION: 1. Small focal cortical hypodensity right frontal lobe which may reflect acute or subacute cortical infarct. 2. No evidence of hemorrhage. Electronically Signed   By: Randa Ngo M.D.   On: 04/06/2020 15:41   US Carotid Bilateral (at [facility] and AP only)  Result Date: 04/06/2020 CLINICAL DATA:  Stroke.  Hypertension, history of tobacco abuse EXAM: BILATERAL CAROTID DUPLEX ULTRASOUND TECHNIQUE: Pearline Cables scale imaging, color Doppler and duplex ultrasound were performed of bilateral carotid and vertebral arteries in the neck. COMPARISON:  None. FINDINGS: Criteria: Quantification of carotid stenosis is based on velocity parameters that correlate the residual internal carotid diameter with NASCET-based stenosis levels, using the diameter of the distal internal carotid lumen as the denominator for stenosis measurement. The following velocity measurements were obtained: RIGHT ICA: 109/45 cm/sec CCA: 58/59 cm/sec SYSTOLIC ICA/CCA RATIO:  1.6 ECA: 172 cm/sec LEFT ICA: 95/41 cm/sec CCA: 29/24 cm/sec SYSTOLIC ICA/CCA RATIO:  1.7 ECA: 69 cm/sec RIGHT CAROTID ARTERY: Eccentric plaque in the bulb with only mild stenosis. Normal waveforms and color Doppler signal throughout. RIGHT VERTEBRAL ARTERY:  Normal flow direction and waveform. LEFT CAROTID ARTERY: Eccentric plaque in the bulb and ICA origin with only  mild stenosis. Normal waveforms and color Doppler signal throughout. LEFT VERTEBRAL ARTERY:  Normal flow direction and waveform. IMPRESSION: 1. Bilateral carotid bifurcation plaque resulting in less than 50% diameter ICA stenosis. 2. Antegrade bilateral vertebral arterial flow. Electronically Signed   By: Lucrezia Europe M.D.   On: 04/06/2020 21:08   US Venous Img Lower Bilateral (DVT)  Result Date: 04/06/2020 CLINICAL DATA:  Edema EXAM: BILATERAL LOWER EXTREMITY VENOUS DOPPLER ULTRASOUND TECHNIQUE: Gray-scale sonography with compression, as well as color and duplex ultrasound, were performed to evaluate the deep venous system(s) from the level of the common femoral vein through the popliteal and proximal calf veins. COMPARISON:  None. FINDINGS: VENOUS Normal compressibility of the common femoral, superficial femoral, and popliteal veins, as well as the visualized calf veins. Visualized portions of profunda femoral vein and great saphenous vein unremarkable. No filling defects to suggest DVT on grayscale or color Doppler imaging. Doppler waveforms show normal direction of venous flow, normal respiratory plasticity and response to augmentation. OTHER None. Limitations: none IMPRESSION: Negative. Electronically Signed   By: Constance Holster M.D.   On: 04/06/2020 18:57   DG Chest Portable 1 View  Result Date: 04/06/2020 CLINICAL DATA:  69 year old male intubated. EXAM: PORTABLE CHEST 1 VIEW COMPARISON:  Portable chest 1427 hours today. FINDINGS: Portable AP supine view at 2128 hours. Intubated and enteric tube in place. Endotracheal tube tip projects between the clavicles and carina. Enteric  tube terminates in the left upper quadrant, side hole at the level of the proximal gastric body. Stable lung volumes and ventilation. Mildly increased pulmonary interstitial markings are stable and nonspecific. Normal cardiac size and mediastinal contours. Negative visible bowel gas pattern. No acute osseous abnormality  identified. IMPRESSION: 1. Satisfactory ET tube and enteric tube placement. 2. Stable ventilation with mildly increased pulmonary interstitial markings. Electronically Signed   By: Genevie Ann M.D.   On: 04/06/2020 21:47   DG Chest Port 1 View  Result Date: 04/06/2020 CLINICAL DATA:  Short of breath, bilateral lower extremity edema, productive cough EXAM: PORTABLE CHEST 1 VIEW COMPARISON:  None. FINDINGS: 2 frontal views of the chest demonstrate an unremarkable cardiac silhouette. There is central vascular congestion without airspace disease, effusion, or pneumothorax. No acute bony abnormalities. IMPRESSION: 1. Central vascular congestion without overt edema. Electronically Signed   By: Randa Ngo M.D.   On: 04/06/2020 14:55    ASSESSMENT / PLAN:  Acute hypercapnic respiratory failure secondary to vascular congestion and suspected undiagnosed COPD  Mechanical Intubation  Hx: Tobacco Abuse  Full vent support for now-vent settings reviewed and established  SBT once all parameters met VAP bundle implemented  Scheduled and prn bronchodilator therapy  IV and nebulized steroids  Continue azithromycin and ceftriaxone for now  Trend WBC and monitor fever curve  Trend PCT  Once extubated will need smoking cessation counseling  Will need outpatient PFT's   Hypotension likely secondary to diuretic therapy and sedating medications Possible CHF  Elevated troponin secondary to NSTEMI vs. demand ischemia   Hx: HTN  Continuous telemetry monitoring  Continue levophed gtt to maintain map >65 Echo pending  Lipid panel and hemoglobin A1c pending  Continue atorvastatin   Acute renal failure  Trend BMP  Replace electrolytes as indicated  Monitor UOP Avoid nephrotoxic medications   Acute vs subacute cortical infarct  Mechanical intubation pain/discomfort  Maintain RASS goal -1 to -2 for now  Versed and fentanyl to maintain RASS goal and for pain management WUA daily  Neurology consulted  appreciate input  MR Brain pending   Best Practice: VTE px: SCD's, avoid chemical prophylaxis for now until able to obtain MR Brain  SUP px: iv pepcid  Diet: keep NPO for now  Code status: Full code   Marda Stalker, [place] Pager [phone] (please enter 7 digits) PCCM Consult Pager [phone] (please enter 7 digits)       PCCM ATTENDING ATTESTATION:  I have evaluated patient with the APP, I personally  reviewed database in its entirety and discussed care plan in detail. In addition, this patient was discussed on multidisciplinary rounds.   I agree with assessment and plan.   Acute resp failure from acute CVA with inability to protect airway  Severe ACUTE Hypoxic and Hypercapnic Respiratory Failure -continue Mechanical Ventilator support -continue Bronchodilator Therapy -Wean Fio2 and PEEP as tolerated -VAP/VENT bundle implementation -will perform SAT/SBT when respiratory parameters are met   NEUROLOGY Acute toxic metabolic encephalopathy, need for sedation Goal RASS -2 to -3 Follow up Neuro recs   ELECTROLYTES -follow labs as needed -replace as needed -pharmacy consultation and following     DVT/GI PRX ordered and assessed TRANSFUSIONS AS NEEDED MONITOR FSBS I Assessed the need for Labs I Assessed the need for Foley I Assessed the need for Central Venous Line Family Discussion when available I Assessed the need for Mobilization I made an Assessment of medications to be adjusted accordingly Safety Risk assessment completed  CASE DISCUSSED IN MULTIDISCIPLINARY ROUNDS WITH ICU TEAM       Critical Care Time devoted to patient care services described in this note is 45 minutes.   Overall, patient is critically ill, prognosis is guarded.  Patient with Multiorgan failure and at high risk for cardiac arrest and death.    Corrin Parker, M.D.  [facility]  Medical Director [place] Director [facility] Cardio-Pulmonary Department

## 2020-04-08 DIAGNOSIS — J810 Acute pulmonary edema: Secondary | ICD-10-CM

## 2020-04-08 DIAGNOSIS — I214 Non-ST elevation (NSTEMI) myocardial infarction: Secondary | ICD-10-CM

## 2020-04-08 LAB — COMPREHENSIVE METABOLIC PANEL
ALT: 84 U/L — ABNORMAL HIGH (ref 0–44)
AST: 47 U/L — ABNORMAL HIGH (ref 15–41)
Albumin: 2.8 g/dL — ABNORMAL LOW (ref 3.5–5.0)
Alkaline Phosphatase: 56 U/L (ref 38–126)
Anion gap: 9 (ref 5–15)
BUN: 37 mg/dL — ABNORMAL HIGH (ref 8–23)
CO2: 30 mmol/L (ref 22–32)
Calcium: 8.6 mg/dL — ABNORMAL LOW (ref 8.9–10.3)
Chloride: 102 mmol/L (ref 98–111)
Creatinine, Ser: 0.88 mg/dL (ref 0.61–1.24)
GFR calc Af Amer: >60 mL/min (ref >60)
GFR calc non Af Amer: >60 mL/min (ref >60)
Glucose, Bld: 153 mg/dL — ABNORMAL HIGH (ref 70–99)
Potassium: 4.2 mmol/L (ref 3.5–5.1)
Sodium: 141 mmol/L (ref 135–145)
Total Bilirubin: 0.5 mg/dL (ref 0.3–1.2)
Total Protein: 5.8 g/dL — ABNORMAL LOW (ref 6.5–8.1)

## 2020-04-08 LAB — CBC WITH DIFFERENTIAL/PLATELET
Abs Immature Granulocytes: 0.05 10*3/uL (ref 0.00–0.07)
Basophils Absolute: 0 10*3/uL (ref 0.0–0.1)
Basophils Relative: 0 %
Eosinophils Absolute: 0 10*3/uL (ref 0.0–0.5)
Eosinophils Relative: 0 %
HCT: 46.8 % (ref 39.0–52.0)
Hemoglobin: 14.8 g/dL (ref 13.0–17.0)
Immature Granulocytes: 1 %
Lymphocytes Relative: 4 %
Lymphs Abs: 0.4 10*3/uL — ABNORMAL LOW (ref 0.7–4.0)
MCH: 29.7 pg (ref 26.0–34.0)
MCHC: 31.6 g/dL (ref 30.0–36.0)
MCV: 94 fL (ref 80.0–100.0)
Monocytes Absolute: 0.5 10*3/uL (ref 0.1–1.0)
Monocytes Relative: 5 %
Neutro Abs: 8.9 10*3/uL — ABNORMAL HIGH (ref 1.7–7.7)
Neutrophils Relative %: 90 %
Platelets: 256 10*3/uL (ref 150–400)
RBC: 4.98 MIL/uL (ref 4.22–5.81)
RDW: 13 % (ref 11.5–15.5)
WBC: 9.8 10*3/uL (ref 4.0–10.5)
nRBC: 0 % (ref 0.0–0.2)

## 2020-04-08 LAB — GLUCOSE, CAPILLARY
Glucose-Capillary: 117 mg/dL — ABNORMAL HIGH (ref 70–99)
Glucose-Capillary: 122 mg/dL — ABNORMAL HIGH (ref 70–99)
Glucose-Capillary: 124 mg/dL — ABNORMAL HIGH (ref 70–99)
Glucose-Capillary: 126 mg/dL — ABNORMAL HIGH (ref 70–99)
Glucose-Capillary: 133 mg/dL — ABNORMAL HIGH (ref 70–99)
Glucose-Capillary: 144 mg/dL — ABNORMAL HIGH (ref 70–99)
Glucose-Capillary: 165 mg/dL — ABNORMAL HIGH (ref 70–99)

## 2020-04-08 LAB — MAGNESIUM: Magnesium: 2.3 mg/dL (ref 1.7–2.4)

## 2020-04-08 LAB — ECHOCARDIOGRAM COMPLETE
Height: 69 in
Weight: 3086.44 oz

## 2020-04-08 LAB — PHOSPHORUS: Phosphorus: 2.5 mg/dL (ref 2.5–4.6)

## 2020-04-08 LAB — PROCALCITONIN: Procalcitonin: 0.1 ng/mL

## 2020-04-08 MED ORDER — METHYLPREDNISOLONE SODIUM SUCC 40 MG IJ SOLR
20.0000 mg | Freq: Two times a day (BID) | INTRAMUSCULAR | Status: DC
  Administered 2020-04-08 – 2020-04-13 (×11): 20 mg via INTRAVENOUS
  Filled 2020-04-08 (×11): qty 1

## 2020-04-08 MED ORDER — NON FORMULARY: 30.0000 mL | Freq: Every day | Status: DC

## 2020-04-08 MED ORDER — ENOXAPARIN SODIUM 40 MG/0.4ML ~~LOC~~ SOLN
40.0000 mg | SUBCUTANEOUS | Status: DC
  Administered 2020-04-08 – 2020-04-15 (×8): 40 mg via SUBCUTANEOUS
  Filled 2020-04-08 (×8): qty 0.4

## 2020-04-08 NOTE — Plan of Care (Signed)
Pt cont on full vent support, 30/5/20/500, pupils are equal and reactive to light. Pt will open eyes with suction, very easily agitated with any activities. Pt is lightly sedated with versed at 2 mcq/hr. Fentanyl at 100 mcq/hr. Pt is tolerating TF well  . Family were updated at side last night. Adequate urine out put in last 12 hrs.

## 2020-04-08 NOTE — Progress Notes (Signed)
CRITICAL CARE NOTE BRIEF PATIENT DESCRIPTION:  69 yo male admitted with acute vs. subacute cortical infarct, elevated troponin's, and acute hypercapnic respiratory failure secondary to vascular congestion and suspected undiagnosed COPD exacerbation requiring mechanical intubation   SIGNIFICANT EVENTS/STUDIES:  07/11: Pt presented to [facility] ER with AMS and acute respiratory failure  07/11: CT Head revealed small focal cortical hypodensity right frontal lobe which may reflect acute or subacute cortical infarct. No evidence of hemorrhage. Neurology consulted recommended MR Brain 07/11: US Venous BLE negative for DVT 07/11: Pt initially admitted to the stepdown unit, however while awaiting for stepdown bed availability he decompensated secondary to respiratory acidosis requiring mechanical intubation and transfer to ICU   7/12 multiorgan failure DNR/DNI 7/13 MRI reports are NEG for acute CVA  CC  follow up respiratory failure  SUBJECTIVE Patient remains critically ill Prognosis is guarded   BP 105/68   Pulse (!) 52   Temp 98.6 F (37 C) (Oral)   Resp 20   Ht '5\' 9"'  (1.753 m)   Wt 87.5 kg   SpO2 95%   BMI 28.49 kg/m    I/O last 3 completed shifts: In: 3134.5 [I.V.:1129.8; NG/GT:270; IV Piggyback:1734.7] Out: 1975 [SJGGE:3662; Emesis/NG output:100] No intake/output data recorded.  SpO2: 95 % O2 Flow Rate (L/min): 4 L/min FiO2 (%): 30 %  Estimated body mass index is 28.49 kg/m as calculated from the following:   Height as of this encounter: '5\' 9"'  (1.753 m).   Weight as of this encounter: 87.5 kg.  SIGNIFICANT EVENTS   REVIEW OF SYSTEMS  PATIENT IS UNABLE TO PROVIDE COMPLETE REVIEW OF SYSTEMS DUE TO SEVERE CRITICAL ILLNESS        PHYSICAL EXAMINATION:  GENERAL:critically ill appearing, +resp distress HEAD: Normocephalic, atraumatic.  EYES: Pupils equal, round, reactive to light.  No scleral icterus.  MOUTH: Moist mucosal membrane. NECK: Supple.  PULMONARY:  +rhonchi, +wheezing CARDIOVASCULAR: S1 and S2. Regular rate and rhythm. No murmurs, rubs, or gallops.  GASTROINTESTINAL: Soft, nontender, -distended.  Positive bowel sounds.   MUSCULOSKELETAL: No swelling, clubbing, or edema.  NEUROLOGIC: obtunded, GCS<8 SKIN:intact,warm,dry  MEDICATIONS: I have reviewed all medications and confirmed regimen as documented   CULTURE RESULTS   Recent Results (from the past 240 hour(s))  SARS Coronavirus 2 by RT PCR (hospital order, performed in [facility] hospital lab) Nasopharyngeal Nasopharyngeal Swab     Status: None   Collection Time: 04/06/20  2:09 PM   Specimen: Nasopharyngeal Swab  Result Value Ref Range Status   SARS Coronavirus 2 NEGATIVE NEGATIVE Final    Comment: (NOTE) SARS-CoV-2 target nucleic acids are NOT DETECTED.  The SARS-CoV-2 RNA is generally detectable in upper and lower respiratory specimens during the acute phase of infection. The lowest concentration of SARS-CoV-2 viral copies this assay can detect is 250 copies / mL. A negative result does not preclude SARS-CoV-2 infection and should not be used as the sole basis for treatment or other patient management decisions.  A negative result may occur with improper specimen collection / handling, submission of specimen other than nasopharyngeal swab, presence of viral mutation(s) within the areas targeted by this assay, and inadequate number of viral copies (<250 copies / mL). A negative result must be combined with clinical observations, patient history, and epidemiological information.  Fact Sheet for Patients:   StrictlyIdeas.no  Fact Sheet for Healthcare Providers: BankingDealers.co.za  This test is not yet approved or  cleared by the [place] FDA and has been authorized for detection and/or diagnosis of SARS-CoV-2  by FDA under an Emergency Use Authorization (EUA).  This EUA will remain in effect (meaning this test can be  used) for the duration of the COVID-19 declaration under Section 564(b)(1) of the Act, 21 U.S.C. section 360bbb-3(b)(1), unless the authorization is terminated or revoked sooner.  Performed at [facility], [address] [place], [place] [address]   Blood culture (routine x 2)     Status: None (Preliminary result)   Collection Time: 04/06/20  3:32 PM   Specimen: BLOOD  Result Value Ref Range Status   Specimen Description BLOOD RIGHT ANTECUBITAL  Final   Special Requests   Final    BOTTLES DRAWN AEROBIC AND ANAEROBIC Blood Culture adequate volume   Culture   Final    NO GROWTH 2 DAYS Performed at [facility], [address] [place], [place] [address]    Report Status PENDING  Incomplete  Blood culture (routine x 2)     Status: None (Preliminary result)   Collection Time: 04/06/20  3:37 PM   Specimen: BLOOD  Result Value Ref Range Status   Specimen Description BLOOD LEFT ANTECUBITAL  Final   Special Requests   Final    BOTTLES DRAWN AEROBIC AND ANAEROBIC Blood Culture adequate volume   Culture   Final    NO GROWTH 2 DAYS Performed at [facility], [address] [place], [place] [address]    Report Status PENDING  Incomplete  MRSA PCR Screening     Status: None   Collection Time: 04/07/20 12:43 AM   Specimen: Nasal Mucosa; Nasopharyngeal  Result Value Ref Range Status   MRSA by PCR NEGATIVE NEGATIVE Final    Comment:        The GeneXpert MRSA Assay (FDA approved for NASAL specimens only), is one component of a comprehensive MRSA colonization surveillance program. It is not intended to diagnose MRSA infection nor to guide or monitor treatment for MRSA infections. Performed at [facility], [address] [place], [place] [address]           IMAGING    MR BRAIN WO CONTRAST  Result Date: 04/07/2020 CLINICAL DATA:  Altered mental status. Worsening weakness and speech disturbance. EXAM: MRI HEAD WITHOUT CONTRAST  TECHNIQUE: Multiplanar, multiecho pulse sequences of the brain and surrounding structures were obtained without intravenous contrast. COMPARISON:  Head CT yesterday. FINDINGS: Brain: Diffusion imaging does not show any acute or subacute infarction. Brainstem and cerebellum are normal. Cerebral hemispheres show mild small vessel change the white matter. No cortical or large vessel territory infarction. No mass lesion, hemorrhage, hydrocephalus or extra-axial collection. There is cerebellar tonsillar extension through the foramen magnum of 1.3 Cm consistent with chronic Chiari malformation. No sign of upper cervical cord syrinx. Vascular: Major vessels at the base of the brain show flow. Skull and upper cervical spine: Negative Sinuses/Orbits: Clear/normal Other: None IMPRESSION: No acute finding by MRI. 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 Mild chronic small-vessel change of the cerebral hemispheric white matter, often seen at this age. Electronically Signed   By: Nelson Chimes M.D.   On: 04/07/2020 14:31     Nutrition Status: Nutrition Problem: Inadequate oral intake Etiology: inability to eat (pt sedated and ventilated) Signs/Symptoms: NPO status    BMP Latest Ref Rng & Units 04/08/2020 04/07/2020 04/06/2020  Glucose 70 - 99 mg/dL 153(H) 140(H) 129(H)  BUN 8 - 23 mg/dL 37(H) 52(H) 63(H)  Creatinine 0.61 - 1.24 mg/dL 0.88 1.31(H) 2.22(H)  Sodium 135 - 145 mmol/L 141 136 133(L)  Potassium 3.5 - 5.1 mmol/L 4.2 4.3 4.9  Chloride 98 - 111 mmol/L 102 101 93(L)  CO2 22 - 32 mmol/L '30 28 26  ' Calcium 8.9 - 10.3 mg/dL 8.6(L) 7.9(L) 8.8(L)      Indwelling Urinary Catheter continued, requirement due to   Reason to continue Indwelling Urinary Catheter strict Intake/Output monitoring for hemodynamic instability         Ventilator continued, requirement due to severe respiratory failure   Ventilator Sedation RASS 0 to -2      ASSESSMENT AND  PLAN SYNOPSIS  69 yo WM with acute NSTEMI with acute pulm edema with acute renal failure  Severe ACUTE Hypoxic and Hypercapnic Respiratory Failure -continue Full MV support -continue Bronchodilator Therapy -Wean Fio2 and PEEP as tolerated -will perform SAT/SBT when respiratory parameters are met -VAP/VENT bundle implementation  ACUTE SYSTOLIC CARDIAC FAILURE- ECHO PENDING +NSTEMI on heparin infusion -follow up cardiac enzymes as indicated -follow up cardiology recs   ACUTE KIDNEY INJURY/Renal Failure -continue Foley Catheter-assess need -Avoid nephrotoxic agents -Follow urine output, BMP -Ensure adequate renal perfusion, optimize oxygenation -Renal dose medications     NEUROLOGY - intubated and sedated - minimal sedation to achieve a RASS goal: -1 Wake up assessment pending    CARDIAC ICU monitoring   GI GI PROPHYLAXIS as indicated     DIET-->TF's as tolerated Constipation protocol as indicated  ENDO - will use ICU hypoglycemic\Hyperglycemia protocol if indicated     ELECTROLYTES -follow labs as needed -replace as needed -pharmacy consultation and following   DVT/GI PRX ordered and assessed TRANSFUSIONS AS NEEDED MONITOR FSBS I Assessed the need for Labs I Assessed the need for Foley I Assessed the need for Central Venous Line Family Discussion when available I Assessed the need for Mobilization I made an Assessment of medications to be adjusted accordingly Safety Risk assessment completed   CASE DISCUSSED IN MULTIDISCIPLINARY ROUNDS WITH ICU TEAM  Critical Care Time devoted to patient care services described in this note is 32 minutes.   Overall, patient is critically ill, prognosis is guarded.  Patient with Multiorgan failure and at high risk for cardiac arrest and death.   DNR status  Corrin Parker, M.D.  [facility]  Medical Director [place] Director [facility] Cardio-Pulmonary  Department

## 2020-04-08 NOTE — Progress Notes (Signed)
[facility] Encounter Note  Patient: Dakota Dunn / Admit Date: 04/06/2020 / Date of Encounter: 04/08/2020, 2:45 PM   Subjective: No apparent significant changes of condition over the last 24 hours.  Overall has had significant respiratory failure overnight multifactorial in nature including stroke with neurologic abnormalities and unable to protect airway now intubated on full respiratory ventilation.  Patient did have congestive heart failure as well causing hypoxia and exacerbation as well.  This includes issues of acute congestive heart failure with elevated BNP non-ST elevation myocardial infarction with elevated troponin abnormal EKG and chest x-ray showing pulmonary edema.  Echocardiogram has shown normal LV systolic function with ejection fraction of 50% and no apparent significant valvular heart disease contributing to above Patient has been on enoxaparin for deep venous thrombosis treatment as well as risk reduction in further myocardial infarction.  Telemetry has shown no evidence of rhythm disturbances Review of Systems: Cannot assess  Objective: Telemetry: Normal sinus rhythm Physical Exam: Blood pressure 122/67, pulse 96, temperature 97.7 F (36.5 C), temperature source Axillary, resp. rate (!) 24, height '5\' 9"'  (1.753 m), weight 87.5 kg, SpO2 92 %. Body mass index is 28.49 kg/m. General: Well developed, well nourished,  Head: Normocephalic, atraumatic, sclera non-icteric, no xanthomas, nares are without discharge. Neck: No apparent masses Lungs: Normal respirations with few wheezes, no rhonchi, no rales , some crackles   Heart: Regular rate and rhythm, normal S1 S2, no murmur, no rub, no gallop, PMI is normal size and placement, carotid upstroke normal without bruit, jugular venous pressure normal Abdomen: Soft, , non-distended with normoactive bowel sounds. No hepatosplenomegaly. Abdominal aorta is normal size without bruit Extremities: 1+ edema, no  clubbing, no cyanosis, no ulcers,  Peripheral: 2+ radial, 2+ femoral, 2+ dorsal pedal pulses Neuro: Not alert and oriented.  Does not moves all extremities spontaneously. Psych: Does not responds to questions appropriately with a normal affect.   Intake/Output Summary (Last 24 hours) at 04/08/2020 1445 Last data filed at 04/08/2020 1300 Gross per 24 hour  Intake 1566.39 ml  Output 1125 ml  Net 441.39 ml    Inpatient Medications:    stroke: mapping our early stages of recovery book   Does not apply Once   atorvastatin  80 mg Per Tube Daily   budesonide (PULMICORT) nebulizer solution  0.5 mg Nebulization BID   chlorhexidine gluconate (MEDLINE KIT)  15 mL Mouth Rinse BID   Chlorhexidine Gluconate Cloth  6 each Topical Daily   docusate  100 mg Per Tube BID   enoxaparin (LOVENOX) injection  40 mg Subcutaneous Q24H   feeding supplement (PRO-STAT 64)  30 mL Per Tube Daily   insulin aspart  0-9 Units Subcutaneous Q4H   ipratropium-albuterol  3 mL Nebulization Q4H   mouth rinse  15 mL Mouth Rinse 10 times per day   methylPREDNISolone (SOLU-MEDROL) injection  20 mg Intravenous Q12H   polyethylene glycol  17 g Per Tube Daily   Infusions:   azithromycin Stopped (04/07/20 1813)   cefTRIAXone (ROCEPHIN)  IV Stopped (04/07/20 1652)   famotidine (PEPCID) IV 100 mL/hr at 04/08/20 1025   feeding supplement (VITAL AF 1.2 CAL) 40 mL/hr at 04/08/20 0100   fentaNYL infusion INTRAVENOUS Stopped (04/08/20 1043)   midazolam Stopped (04/08/20 1040)   norepinephrine (LEVOPHED) Adult infusion 4 mcg/min (04/08/20 1300)    Labs: Recent Labs    04/07/20 0419 04/08/20 0451  NA 136 141  K 4.3 4.2  CL 101 102  CO2 28 30  GLUCOSE  140* 153*  BUN 52* 37*  CREATININE 1.31* 0.88  CALCIUM 7.9* 8.6*  MG 2.1 2.3  PHOS 2.2* 2.5   Recent Labs    04/07/20 0419 04/08/20 0451  AST 69* 47*  ALT 98* 84*  ALKPHOS 64 56  BILITOT 0.6 0.5  PROT 6.0* 5.8*  ALBUMIN 3.0* 2.8*   Recent  Labs    04/07/20 0419 04/08/20 0451  WBC 6.7 9.8  NEUTROABS 5.8 8.9*  HGB 15.2 14.8  HCT 47.8 46.8  MCV 93.2 94.0  PLT 240 256   No results for input(s): CKTOTAL, CKMB, TROPONINI in the last 72 hours. Invalid input(s): POCBNP Recent Labs    04/07/20 0419  HGBA1C 5.7*     Weights: Filed Weights   04/06/20 1406 04/07/20 0024  Weight: 83.9 kg 87.5 kg     Radiology/Studies:  CT Head Wo Contrast  Result Date: 04/06/2020 CLINICAL DATA:  Increasing weakness, altered level of consciousness, difficulty speaking for several days EXAM: CT HEAD WITHOUT CONTRAST TECHNIQUE: Contiguous axial images were obtained from the base of the skull through the vertex without intravenous contrast. COMPARISON:  None. FINDINGS: Brain: There is a focal area of cortical hypodensity in the right frontal lobe, reference images 11-13 of series 3, which could reflect an area of acute or subacute infarct. No evidence of hemorrhage. Lateral ventricles and midline structures are unremarkable. No acute extra-axial fluid collections. No mass effect. Vascular: No hyperdense vessel or unexpected calcification. Skull: Normal. Negative for fracture or focal lesion. Sinuses/Orbits: No acute finding. Other: None. IMPRESSION: 1. Small focal cortical hypodensity right frontal lobe which may reflect acute or subacute cortical infarct. 2. No evidence of hemorrhage. Electronically Signed   By: Randa Ngo M.D.   On: 04/06/2020 15:41   MR BRAIN WO CONTRAST  Result Date: 04/07/2020 CLINICAL DATA:  Altered mental status. Worsening weakness and speech disturbance. EXAM: MRI HEAD WITHOUT CONTRAST TECHNIQUE: Multiplanar, multiecho pulse sequences of the brain and surrounding structures were obtained without intravenous contrast. COMPARISON:  Head CT yesterday. FINDINGS: Brain: Diffusion imaging does not show any acute or subacute infarction. Brainstem and cerebellum are normal. Cerebral hemispheres show mild small vessel change the  white matter. No cortical or large vessel territory infarction. No mass lesion, hemorrhage, hydrocephalus or extra-axial collection. There is cerebellar tonsillar extension through the foramen magnum of 1.3 Cm consistent with chronic Chiari malformation. No sign of upper cervical cord syrinx. Vascular: Major vessels at the base of the brain show flow. Skull and upper cervical spine: Negative Sinuses/Orbits: Clear/normal Other: None IMPRESSION: No acute finding by MRI. 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 Mild chronic small-vessel change of the cerebral hemispheric white matter, often seen at this age. Electronically Signed   By: Nelson Chimes M.D.   On: 04/07/2020 14:31   US Carotid Bilateral (at [facility] and [facility] only)  Result Date: 04/06/2020 CLINICAL DATA:  Stroke.  Hypertension, history of tobacco abuse EXAM: BILATERAL CAROTID DUPLEX ULTRASOUND TECHNIQUE: Pearline Cables scale imaging, color Doppler and duplex ultrasound were performed of bilateral carotid and vertebral arteries in the neck. COMPARISON:  None. FINDINGS: Criteria: Quantification of carotid stenosis is based on velocity parameters that correlate the residual internal carotid diameter with NASCET-based stenosis levels, using the diameter of the distal internal carotid lumen as the denominator for stenosis measurement. The following velocity measurements were obtained: RIGHT ICA: 109/45 cm/sec CCA: 92/44 cm/sec SYSTOLIC ICA/CCA RATIO:  1.6 ECA: 172 cm/sec LEFT ICA: 95/41 cm/sec CCA: 57/16 cm/sec  SYSTOLIC ICA/CCA RATIO:  1.7 ECA: 69 cm/sec RIGHT CAROTID ARTERY: Eccentric plaque in the bulb with only mild stenosis. Normal waveforms and color Doppler signal throughout. RIGHT VERTEBRAL ARTERY:  Normal flow direction and waveform. LEFT CAROTID ARTERY: Eccentric plaque in the bulb and ICA origin with only mild stenosis. Normal waveforms and color Doppler signal throughout. LEFT VERTEBRAL ARTERY:  Normal flow  direction and waveform. IMPRESSION: 1. Bilateral carotid bifurcation plaque resulting in less than 50% diameter ICA stenosis. 2. Antegrade bilateral vertebral arterial flow. Electronically Signed   By: Lucrezia Europe M.D.   On: 04/06/2020 21:08   US Venous Img Lower Bilateral (DVT)  Result Date: 04/06/2020 CLINICAL DATA:  Edema EXAM: BILATERAL LOWER EXTREMITY VENOUS DOPPLER ULTRASOUND TECHNIQUE: Gray-scale sonography with compression, as well as color and duplex ultrasound, were performed to evaluate the deep venous system(s) from the level of the common femoral vein through the popliteal and proximal calf veins. COMPARISON:  None. FINDINGS: VENOUS Normal compressibility of the common femoral, superficial femoral, and popliteal veins, as well as the visualized calf veins. Visualized portions of profunda femoral vein and great saphenous vein unremarkable. No filling defects to suggest DVT on grayscale or color Doppler imaging. Doppler waveforms show normal direction of venous flow, normal respiratory plasticity and response to augmentation. OTHER None. Limitations: none IMPRESSION: Negative. Electronically Signed   By: Constance Holster M.D.   On: 04/06/2020 18:57   DG Chest Portable 1 View  Result Date: 04/06/2020 CLINICAL DATA:  69 year old male intubated. EXAM: PORTABLE CHEST 1 VIEW COMPARISON:  Portable chest 1427 hours today. FINDINGS: Portable AP supine view at 2128 hours. Intubated and enteric tube in place. Endotracheal tube tip projects between the clavicles and carina. Enteric tube terminates in the left upper quadrant, side hole at the level of the proximal gastric body. Stable lung volumes and ventilation. Mildly increased pulmonary interstitial markings are stable and nonspecific. Normal cardiac size and mediastinal contours. Negative visible bowel gas pattern. No acute osseous abnormality identified. IMPRESSION: 1. Satisfactory ET tube and enteric tube placement. 2. Stable ventilation with mildly  increased pulmonary interstitial markings. Electronically Signed   By: Genevie Ann M.D.   On: 04/06/2020 21:47   DG Chest Port 1 View  Result Date: 04/06/2020 CLINICAL DATA:  Short of breath, bilateral lower extremity edema, productive cough EXAM: PORTABLE CHEST 1 VIEW COMPARISON:  None. FINDINGS: 2 frontal views of the chest demonstrate an unremarkable cardiac silhouette. There is central vascular congestion without airspace disease, effusion, or pneumothorax. No acute bony abnormalities. IMPRESSION: 1. Central vascular congestion without overt edema. Electronically Signed   By: Randa Ngo M.D.   On: 04/06/2020 14:55   ECHOCARDIOGRAM COMPLETE  Result Date: 04/08/2020    ECHOCARDIOGRAM REPORT   Patient Name:   Dakota Dunn Date of Exam: 04/07/2020 Medical Rec #:  [id number]        Height:       69.0 in Accession #:    [id number]       Weight:       192.9 lb Date of Birth:  [birth year]         BSA:          2.035 m Patient Age:    69 years         BP:           103/57 mmHg Patient Gender: M                HR:  92 bpm. Exam Location:  [facility] Procedure: 2D Echo, Cardiac Doppler and Limited Color Doppler Indications:     I21.4 NSTEMI; I163.9 Stroke  History:         Patient has no prior history of Echocardiogram examinations.                  Risk Factors:Hypertension.  Sonographer:     Charmayne Sheer RDCS (AE) Referring Phys:  [id number] [facility] Diagnosing Phys: Yolonda Kida MD  Sonographer Comments: Technically difficult study due to poor echo windows, no apical window, suboptimal parasternal window and echo performed with patient supine and on artificial respirator. IMPRESSIONS  1. Left ventricular ejection fraction, by estimation, is 50 to 55%. The left ventricle has normal function. The left ventricle has no regional wall motion abnormalities. Left ventricular diastolic parameters were normal.  2. Right ventricular systolic function is normal. The right ventricular size is normal.  3. The mitral  valve is normal in structure. No evidence of mitral valve regurgitation.  4. The aortic valve is normal in structure. Aortic valve regurgitation is not visualized. FINDINGS  Left Ventricle: Left ventricular ejection fraction, by estimation, is 50 to 55%. The left ventricle has normal function. The left ventricle has no regional wall motion abnormalities. The left ventricular internal cavity size was normal in size. There is  no left ventricular hypertrophy. Left ventricular diastolic parameters were normal. Right Ventricle: The right ventricular size is normal. No increase in right ventricular wall thickness. Right ventricular systolic function is normal. Left Atrium: Left atrial size was normal in size. Right Atrium: Right atrial size was normal in size. Pericardium: There is no evidence of pericardial effusion. Mitral Valve: The mitral valve is normal in structure. No evidence of mitral valve regurgitation. Tricuspid Valve: The tricuspid valve is normal in structure. Tricuspid valve regurgitation is trivial. Aortic Valve: The aortic valve is normal in structure. Aortic valve regurgitation is not visualized. Pulmonic Valve: The pulmonic valve was normal in structure. Pulmonic valve regurgitation is not visualized. Aorta: The aortic root is normal in size and structure. IAS/Shunts: No atrial level shunt detected by color flow Doppler.  LEFT VENTRICLE PLAX 2D LVIDd:         4.25 cm LVIDs:         3.19 cm LV PW:         1.00 cm LV IVS:        0.86 cm LVOT diam:     2.10 cm LVOT Area:     3.46 cm  LEFT ATRIUM         Index LA diam:    3.10 cm 1.52 cm/m                        PULMONIC VALVE AORTA                 PV Vmax:       1.36 m/s Ao Root diam: 2.90 cm PV Vmean:      89.700 cm/s                       PV VTI:        0.233 m                       PV Peak grad:  7.4 mmHg  PV Mean grad:  4.0 mmHg   SHUNTS Systemic Diam: 2.10 cm Yolonda Kida MD Electronically signed by Yolonda Kida MD  Signature Date/Time: 04/08/2020/1:52:22 PM    Final      Assessment and Recommendation  69 y.o. male with heavy tobacco abuse hypertension hyperlipidemia with apparent acute stroke causing respiratory failure due to lack of protection of airway needing intubation and protection with acute congestive heart failure myocardial infarction pulmonary edema and kidney dysfunction.  Although current MRI appears not to have shown an acute stroke but more consistent with metabolic encephalopathy 1.  Continue supportive care for acute respiratory failure due to above without restriction 2.  Continuation of enoxaparin for deep venous thrombosis prevention and now status post 48 hours of heparin can change to aspirin from the non-ST elevation myocardial infarction aspect 3.  No further cardiac diagnostics necessary at this time 4.  We will follow for change in condition and need for further intervention after about  Signed, Serafina Royals M.D. FACC

## 2020-04-09 ENCOUNTER — Inpatient Hospital Stay: Payer: Medicare Other

## 2020-04-09 LAB — CBC WITH DIFFERENTIAL/PLATELET
Abs Immature Granulocytes: 0.05 10*3/uL (ref 0.00–0.07)
Basophils Absolute: 0 10*3/uL (ref 0.0–0.1)
Basophils Relative: 0 %
Eosinophils Absolute: 0 10*3/uL (ref 0.0–0.5)
Eosinophils Relative: 0 %
HCT: 44.7 % (ref 39.0–52.0)
Hemoglobin: 14.7 g/dL (ref 13.0–17.0)
Immature Granulocytes: 1 %
Lymphocytes Relative: 5 %
Lymphs Abs: 0.5 10*3/uL — ABNORMAL LOW (ref 0.7–4.0)
MCH: 30.4 pg (ref 26.0–34.0)
MCHC: 32.9 g/dL (ref 30.0–36.0)
MCV: 92.5 fL (ref 80.0–100.0)
Monocytes Absolute: 0.7 10*3/uL (ref 0.1–1.0)
Monocytes Relative: 7 %
Neutro Abs: 9 10*3/uL — ABNORMAL HIGH (ref 1.7–7.7)
Neutrophils Relative %: 87 %
Platelets: 236 10*3/uL (ref 150–400)
RBC: 4.83 MIL/uL (ref 4.22–5.81)
RDW: 13.4 % (ref 11.5–15.5)
WBC: 10.2 10*3/uL (ref 4.0–10.5)
nRBC: 0 % (ref 0.0–0.2)

## 2020-04-09 LAB — GLUCOSE, CAPILLARY
Glucose-Capillary: 111 mg/dL — ABNORMAL HIGH (ref 70–99)
Glucose-Capillary: 119 mg/dL — ABNORMAL HIGH (ref 70–99)
Glucose-Capillary: 112 mg/dL — ABNORMAL HIGH (ref 70–99)
Glucose-Capillary: 137 mg/dL — ABNORMAL HIGH (ref 70–99)
Glucose-Capillary: 112 mg/dL — ABNORMAL HIGH (ref 70–99)

## 2020-04-09 LAB — COMPREHENSIVE METABOLIC PANEL
ALT: 70 U/L — ABNORMAL HIGH (ref 0–44)
AST: 34 U/L (ref 15–41)
Albumin: 2.8 g/dL — ABNORMAL LOW (ref 3.5–5.0)
Alkaline Phosphatase: 51 U/L (ref 38–126)
Anion gap: 7 (ref 5–15)
BUN: 37 mg/dL — ABNORMAL HIGH (ref 8–23)
CO2: 33 mmol/L — ABNORMAL HIGH (ref 22–32)
Calcium: 8.5 mg/dL — ABNORMAL LOW (ref 8.9–10.3)
Chloride: 104 mmol/L (ref 98–111)
Creatinine, Ser: 0.87 mg/dL (ref 0.61–1.24)
GFR calc Af Amer: >60 mL/min (ref >60)
GFR calc non Af Amer: >60 mL/min (ref >60)
Glucose, Bld: 135 mg/dL — ABNORMAL HIGH (ref 70–99)
Potassium: 4.5 mmol/L (ref 3.5–5.1)
Sodium: 144 mmol/L (ref 135–145)
Total Bilirubin: 0.6 mg/dL (ref 0.3–1.2)
Total Protein: 5.5 g/dL — ABNORMAL LOW (ref 6.5–8.1)

## 2020-04-09 LAB — PHOSPHORUS: Phosphorus: 2.5 mg/dL (ref 2.5–4.6)

## 2020-04-09 LAB — MAGNESIUM: Magnesium: 2.3 mg/dL (ref 1.7–2.4)

## 2020-04-09 NOTE — Progress Notes (Signed)
CRITICAL CARE NOTE 69 yo male admitted with acute vs. subacute cortical infarct, elevated troponin's, and acute hypercapnic respiratory failure secondary to vascular congestion and suspected undiagnosed COPD exacerbation requiring mechanical intubation  SIGNIFICANT EVENTS/STUDIES: 07/11: Pt presented to [facility] ER with AMS and acute respiratory failure  07/11: CT Head revealed small focal cortical hypodensity right frontal lobe which may reflect acute or subacute cortical infarct. No evidence of hemorrhage.Neurology consulted recommended MR Brain 07/11: US Venous BLE negative for DVT 07/11: Pt initially admitted to the stepdown unit, however while awaiting for stepdown bed availability he decompensated secondary to respiratory acidosis requiring mechanical intubation and transfer to ICU 7/12 multiorgan failure DNR/DNI 7/13 MRI reports are NEG for acute CVA 7/14 plan for SAT/SBT today  CC  follow up respiratory failure  SUBJECTIVE Patient remains critically ill Prognosis is guarded   BP (!) 91/53    Pulse (!) 53    Temp (!) 97.4 F (36.3 C) (Axillary)    Resp 20    Ht '5\' 9"'  (1.753 m)    Wt 87.5 kg    SpO2 94%    BMI 28.49 kg/m    I/O last 3 completed shifts: In: 1806.5 [I.V.:679.8; HDQQI:297; NG/GT:470; IV Piggyback:431.7] Out: 1175 [Urine:1175] No intake/output data recorded.  SpO2: 94 % O2 Flow Rate (L/min): 4 L/min FiO2 (%): 30 %  Estimated body mass index is 28.49 kg/m as calculated from the following:   Height as of this encounter: '5\' 9"'  (1.753 m).   Weight as of this encounter: 87.5 kg.  SIGNIFICANT EVENTS   REVIEW OF SYSTEMS  PATIENT IS UNABLE TO PROVIDE COMPLETE REVIEW OF SYSTEMS DUE TO SEVERE CRITICAL ILLNESS        PHYSICAL EXAMINATION:  GENERAL:critically ill appearing, +resp distress HEAD: Normocephalic, atraumatic.  EYES: Pupils equal, round, reactive to light.  No scleral icterus.  MOUTH: Moist mucosal membrane. NECK: Supple.  PULMONARY:  +rhonchi, +wheezing CARDIOVASCULAR: S1 and S2. Regular rate and rhythm. No murmurs, rubs, or gallops.  GASTROINTESTINAL: Soft, nontender, -distended.  Positive bowel sounds.   MUSCULOSKELETAL: No swelling, clubbing, or edema.  NEUROLOGIC: obtunded, GCS<8 SKIN:intact,warm,dry  MEDICATIONS: I have reviewed all medications and confirmed regimen as documented   CULTURE RESULTS   Recent Results (from the past 240 hour(s))  SARS Coronavirus 2 by RT PCR (hospital order, performed in [facility] hospital lab) Nasopharyngeal Nasopharyngeal Swab     Status: None   Collection Time: 04/06/20  2:09 PM   Specimen: Nasopharyngeal Swab  Result Value Ref Range Status   SARS Coronavirus 2 NEGATIVE NEGATIVE Final    Comment: (NOTE) SARS-CoV-2 target nucleic acids are NOT DETECTED.  The SARS-CoV-2 RNA is generally detectable in upper and lower respiratory specimens during the acute phase of infection. The lowest concentration of SARS-CoV-2 viral copies this assay can detect is 250 copies / mL. A negative result does not preclude SARS-CoV-2 infection and should not be used as the sole basis for treatment or other patient management decisions.  A negative result may occur with improper specimen collection / handling, submission of specimen other than nasopharyngeal swab, presence of viral mutation(s) within the areas targeted by this assay, and inadequate number of viral copies (<250 copies / mL). A negative result must be combined with clinical observations, patient history, and epidemiological information.  Fact Sheet for Patients:   StrictlyIdeas.no  Fact Sheet for Healthcare Providers: BankingDealers.co.za  This test is not yet approved or  cleared by the [place] FDA and has been authorized for detection  and/or diagnosis of SARS-CoV-2 by FDA under an Emergency Use Authorization (EUA).  This EUA will remain in effect (meaning this test can be  used) for the duration of the COVID-19 declaration under Section 564(b)(1) of the Act, 21 U.S.C. section 360bbb-3(b)(1), unless the authorization is terminated or revoked sooner.  Performed at [facility], [address] [place], [place] [address]   Blood culture (routine x 2)     Status: None (Preliminary result)   Collection Time: 04/06/20  3:32 PM   Specimen: BLOOD  Result Value Ref Range Status   Specimen Description BLOOD RIGHT ANTECUBITAL  Final   Special Requests   Final    BOTTLES DRAWN AEROBIC AND ANAEROBIC Blood Culture adequate volume   Culture   Final    NO GROWTH 3 DAYS Performed at [facility], [address] [place], [place] [address]    Report Status PENDING  Incomplete  Blood culture (routine x 2)     Status: None (Preliminary result)   Collection Time: 04/06/20  3:37 PM   Specimen: BLOOD  Result Value Ref Range Status   Specimen Description BLOOD LEFT ANTECUBITAL  Final   Special Requests   Final    BOTTLES DRAWN AEROBIC AND ANAEROBIC Blood Culture adequate volume   Culture   Final    NO GROWTH 3 DAYS Performed at [facility], [address] [place], [place] [address]    Report Status PENDING  Incomplete  MRSA PCR Screening     Status: None   Collection Time: 04/07/20 12:43 AM   Specimen: Nasal Mucosa; Nasopharyngeal  Result Value Ref Range Status   MRSA by PCR NEGATIVE NEGATIVE Final    Comment:        The GeneXpert MRSA Assay (FDA approved for NASAL specimens only), is one component of a comprehensive MRSA colonization surveillance program. It is not intended to diagnose MRSA infection nor to guide or monitor treatment for MRSA infections. Performed at [facility], [address] [place], [place] [address]           IMAGING    DG Chest Port 1 View  Result Date: 04/09/2020 CLINICAL DATA:  Acute respiratory failure EXAM: PORTABLE CHEST 1 VIEW COMPARISON:  Three days ago FINDINGS: Endotracheal  tube tip is just below the clavicular heads. The enteric tube tip reaches the stomach with side port near the GE junction. Normal heart size and mediastinal contours. No visible effusion or pneumothorax. Mild interstitial coarsening without Kerley lines. No consolidation. IMPRESSION: Stable hardware positioning. Interstitial prominence but no edema or focal pneumonia. Electronically Signed   By: Monte Fantasia M.D.   On: 04/09/2020 07:21     Nutrition Status: Nutrition Problem: Inadequate oral intake Etiology: inability to eat (pt sedated and ventilated) Signs/Symptoms: NPO status    BMP Latest Ref Rng & Units 04/09/2020 04/08/2020 04/07/2020  Glucose 70 - 99 mg/dL 135(H) 153(H) 140(H)  BUN 8 - 23 mg/dL 37(H) 37(H) 52(H)  Creatinine 0.61 - 1.24 mg/dL 0.87 0.88 1.31(H)  Sodium 135 - 145 mmol/L 144 141 136  Potassium 3.5 - 5.1 mmol/L 4.5 4.2 4.3  Chloride 98 - 111 mmol/L 104 102 101  CO2 22 - 32 mmol/L 33(H) 30 28  Calcium 8.9 - 10.3 mg/dL 8.5(L) 8.6(L) 7.9(L)       Indwelling Urinary Catheter continued, requirement due to   Reason to continue Indwelling Urinary Catheter strict Intake/Output monitoring for hemodynamic instability         Ventilator continued,  requirement due to severe respiratory failure   Ventilator Sedation RASS 0 to -2      ASSESSMENT AND PLAN SYNOPSIS  69 yo WM with acute NSTEMI with acute pulm edema with acute renal failure   Severe ACUTE Hypoxic and Hypercapnic Respiratory Failure -continue Full MV support -continue Bronchodilator Therapy -Wean Fio2 and PEEP as tolerated -will perform SAT/SBT when respiratory parameters are met -VAP/VENT bundle implementation  ACUTE SYSTOLIC CARDIAC FAILURE-  NSTEMI -oxygen as needed -Lasix as tolerated -follow up cardiac enzymes as indicated -follow up cardiology recs   obesity, possible OSA.   Will certainly impact respiratory mechanics, ventilator weaning    NEUROLOGY - intubated and sedated -  minimal sedation to achieve a RASS goal: -1 Wake up assessment pending when family arrives  CARDIAC ICU monitoring  GI GI PROPHYLAXIS as indicated   DIET-->TF's as tolerated Constipation protocol as indicated  ENDO - will use ICU hypoglycemic\Hyperglycemia protocol if indicated     ELECTROLYTES -follow labs as needed -replace as needed -pharmacy consultation and following   DVT/GI PRX ordered and assessed TRANSFUSIONS AS NEEDED MONITOR FSBS I Assessed the need for Labs I Assessed the need for Foley I Assessed the need for Central Venous Line Family Discussion when available I Assessed the need for Mobilization I made an Assessment of medications to be adjusted accordingly Safety Risk assessment completed   CASE DISCUSSED IN MULTIDISCIPLINARY ROUNDS WITH ICU TEAM  Critical Care Time devoted to patient care services described in this note is 32 minutes.   Overall, patient is critically ill, prognosis is guarded.      Corrin Parker, M.D.  [facility]  Medical Director [facility] Director [facility] Cardio-Pulmonary Department

## 2020-04-10 DIAGNOSIS — I5021 Acute systolic (congestive) heart failure: Secondary | ICD-10-CM

## 2020-04-10 LAB — BASIC METABOLIC PANEL
Anion gap: 6 (ref 5–15)
BUN: 38 mg/dL — ABNORMAL HIGH (ref 8–23)
CO2: 35 mmol/L — ABNORMAL HIGH (ref 22–32)
Calcium: 8.8 mg/dL — ABNORMAL LOW (ref 8.9–10.3)
Chloride: 104 mmol/L (ref 98–111)
Creatinine, Ser: 0.77 mg/dL (ref 0.61–1.24)
GFR calc Af Amer: >60 mL/min (ref >60)
GFR calc non Af Amer: >60 mL/min (ref >60)
Glucose, Bld: 134 mg/dL — ABNORMAL HIGH (ref 70–99)
Potassium: 5.2 mmol/L — ABNORMAL HIGH (ref 3.5–5.1)
Sodium: 145 mmol/L (ref 135–145)

## 2020-04-10 LAB — CBC WITH DIFFERENTIAL/PLATELET
Abs Immature Granulocytes: 0.05 10*3/uL (ref 0.00–0.07)
Basophils Absolute: 0 10*3/uL (ref 0.0–0.1)
Basophils Relative: 0 %
Eosinophils Absolute: 0 10*3/uL (ref 0.0–0.5)
Eosinophils Relative: 0 %
HCT: 47.8 % (ref 39.0–52.0)
Hemoglobin: 14.9 g/dL (ref 13.0–17.0)
Immature Granulocytes: 1 %
Lymphocytes Relative: 5 %
Lymphs Abs: 0.5 10*3/uL — ABNORMAL LOW (ref 0.7–4.0)
MCH: 30 pg (ref 26.0–34.0)
MCHC: 31.2 g/dL (ref 30.0–36.0)
MCV: 96.2 fL (ref 80.0–100.0)
Monocytes Absolute: 0.7 10*3/uL (ref 0.1–1.0)
Monocytes Relative: 7 %
Neutro Abs: 9.1 10*3/uL — ABNORMAL HIGH (ref 1.7–7.7)
Neutrophils Relative %: 87 %
Platelets: 228 10*3/uL (ref 150–400)
RBC: 4.97 MIL/uL (ref 4.22–5.81)
RDW: 13.6 % (ref 11.5–15.5)
WBC: 10.4 10*3/uL (ref 4.0–10.5)
nRBC: 0 % (ref 0.0–0.2)

## 2020-04-10 LAB — GLUCOSE, CAPILLARY
Glucose-Capillary: 101 mg/dL — ABNORMAL HIGH (ref 70–99)
Glucose-Capillary: 109 mg/dL — ABNORMAL HIGH (ref 70–99)
Glucose-Capillary: 111 mg/dL — ABNORMAL HIGH (ref 70–99)
Glucose-Capillary: 123 mg/dL — ABNORMAL HIGH (ref 70–99)
Glucose-Capillary: 128 mg/dL — ABNORMAL HIGH (ref 70–99)
Glucose-Capillary: 130 mg/dL — ABNORMAL HIGH (ref 70–99)
Glucose-Capillary: 95 mg/dL (ref 70–99)

## 2020-04-10 LAB — TRIGLYCERIDES: Triglycerides: 60 mg/dL (ref <150)

## 2020-04-10 LAB — PHOSPHORUS: Phosphorus: 3.5 mg/dL (ref 2.5–4.6)

## 2020-04-10 LAB — MAGNESIUM: Magnesium: 2.2 mg/dL (ref 1.7–2.4)

## 2020-04-10 MED ORDER — ACETAZOLAMIDE SODIUM 500 MG IJ SOLR
500.0000 mg | Freq: Once | INTRAMUSCULAR | Status: AC
  Administered 2020-04-10: 500 mg via INTRAVENOUS
  Filled 2020-04-10: qty 500

## 2020-04-10 MED ORDER — PROPOFOL 1000 MG/100ML IV EMUL
5.0000 ug/kg/min | INTRAVENOUS | Status: DC
  Administered 2020-04-10: 10 ug/kg/min via INTRAVENOUS
  Administered 2020-04-11: 30 ug/kg/min via INTRAVENOUS
  Filled 2020-04-10 (×2): qty 100

## 2020-04-10 NOTE — Progress Notes (Signed)
CRITICAL CARE NOTE 69 yo male admitted with acute vs. subacute cortical infarct, elevated troponin's, and acute hypercapnic respiratory failure secondary to vascular congestion and suspected undiagnosed COPD exacerbation requiring mechanical intubation  SIGNIFICANT EVENTS/STUDIES: 07/11: Pt presented to [facility] ER with AMS and acute respiratory failure  07/11: CT Head revealed small focal cortical hypodensity right frontal lobe which may reflect acute or subacute cortical infarct. No evidence of hemorrhage.Neurology consulted recommended MR Brain 07/11: US Venous BLE negative for DVT 07/11: Pt initially admitted to the stepdown unit, however while awaiting for stepdown bed availability he decompensated secondary to respiratory acidosis requiring mechanical intubation and transfer to ICU 7/12 multiorgan failure DNR/DNI 7/13 MRI reports are NEG for acute CVA 7/14 plan for SAT/SBT today 7/14 failed SAT/SBT , family at bedisde witnessed    CC  follow up respiratory failure  SUBJECTIVE Patient remains critically ill Prognosis is guarded Acute CHF Acute NSTEMI   BP (!) 98/59   Pulse 69   Temp 97.7 F (36.5 C) (Axillary)   Resp 20   Ht 5' 9.02" (1.753 m)   Wt 91.1 kg   SpO2 93%   BMI 29.64 kg/m    I/O last 3 completed shifts: In: 923.7 [I.V.:498.6; IV Piggyback:425.1] Out: 1205 [Urine:1205] No intake/output data recorded.  SpO2: 93 % O2 Flow Rate (L/min): 4 L/min FiO2 (%): 30 %  Estimated body mass index is 29.64 kg/m as calculated from the following:   Height as of this encounter: 5' 9.02" (1.753 m).   Weight as of this encounter: 91.1 kg.  SIGNIFICANT EVENTS   REVIEW OF SYSTEMS  PATIENT IS UNABLE TO PROVIDE COMPLETE REVIEW OF SYSTEMS DUE TO SEVERE CRITICAL ILLNESS        PHYSICAL EXAMINATION:  GENERAL:critically ill appearing, +resp distress HEAD: Normocephalic, atraumatic.  EYES: Pupils equal, round, reactive to light.  No scleral icterus.  MOUTH:  Moist mucosal membrane. NECK: Supple.  PULMONARY: +rhonchi, +wheezing CARDIOVASCULAR: S1 and S2. Regular rate and rhythm. No murmurs, rubs, or gallops.  GASTROINTESTINAL: Soft, nontender, -distended.  Positive bowel sounds.   MUSCULOSKELETAL: No swelling, clubbing, or edema.  NEUROLOGIC: obtunded, GCS<8 SKIN:intact,warm,dry  MEDICATIONS: I have reviewed all medications and confirmed regimen as documented   CULTURE RESULTS   Recent Results (from the past 240 hour(s))  SARS Coronavirus 2 by RT PCR (hospital order, performed in [facility] hospital lab) Nasopharyngeal Nasopharyngeal Swab     Status: None   Collection Time: 04/06/20  2:09 PM   Specimen: Nasopharyngeal Swab  Result Value Ref Range Status   SARS Coronavirus 2 NEGATIVE NEGATIVE Final    Comment: (NOTE) SARS-CoV-2 target nucleic acids are NOT DETECTED.  The SARS-CoV-2 RNA is generally detectable in upper and lower respiratory specimens during the acute phase of infection. The lowest concentration of SARS-CoV-2 viral copies this assay can detect is 250 copies / mL. A negative result does not preclude SARS-CoV-2 infection and should not be used as the sole basis for treatment or other patient management decisions.  A negative result may occur with improper specimen collection / handling, submission of specimen other than nasopharyngeal swab, presence of viral mutation(s) within the areas targeted by this assay, and inadequate number of viral copies (<250 copies / mL). A negative result must be combined with clinical observations, patient history, and epidemiological information.  Fact Sheet for Patients:   StrictlyIdeas.no  Fact Sheet for Healthcare Providers: BankingDealers.co.za  This test is not yet approved or  cleared by the [place] FDA and has been  authorized for detection and/or diagnosis of SARS-CoV-2 by FDA under an Emergency Use Authorization (EUA).  This  EUA will remain in effect (meaning this test can be used) for the duration of the COVID-19 declaration under Section 564(b)(1) of the Act, 21 U.S.C. section 360bbb-3(b)(1), unless the authorization is terminated or revoked sooner.  Performed at [facility], [address] [place], [place] [address]   Blood culture (routine x 2)     Status: None (Preliminary result)   Collection Time: 04/06/20  3:32 PM   Specimen: BLOOD  Result Value Ref Range Status   Specimen Description BLOOD RIGHT ANTECUBITAL  Final   Special Requests   Final    BOTTLES DRAWN AEROBIC AND ANAEROBIC Blood Culture adequate volume   Culture   Final    NO GROWTH 4 DAYS Performed at [facility], [address] [place], [place] [address]    Report Status PENDING  Incomplete  Blood culture (routine x 2)     Status: None (Preliminary result)   Collection Time: 04/06/20  3:37 PM   Specimen: BLOOD  Result Value Ref Range Status   Specimen Description BLOOD LEFT ANTECUBITAL  Final   Special Requests   Final    BOTTLES DRAWN AEROBIC AND ANAEROBIC Blood Culture adequate volume   Culture   Final    NO GROWTH 4 DAYS Performed at [facility], [address], [place], [place] [address]    Report Status PENDING  Incomplete  MRSA PCR Screening     Status: None   Collection Time: 04/07/20 12:43 AM   Specimen: Nasal Mucosa; Nasopharyngeal  Result Value Ref Range Status   MRSA by PCR NEGATIVE NEGATIVE Final    Comment:        The GeneXpert MRSA Assay (FDA approved for NASAL specimens only), is one component of a comprehensive MRSA colonization surveillance program. It is not intended to diagnose MRSA infection nor to guide or monitor treatment for MRSA infections. Performed at [facility], [address] [place], [place] [address]         BMP Latest Ref Rng & Units 04/10/2020 04/09/2020 04/08/2020  Glucose 70 - 99 mg/dL 134(H) 135(H) 153(H)  BUN 8 - 23 mg/dL 38(H) 37(H)  37(H)  Creatinine 0.61 - 1.24 mg/dL 0.77 0.87 0.88  Sodium 135 - 145 mmol/L 145 144 141  Potassium 3.5 - 5.1 mmol/L 5.2(H) 4.5 4.2  Chloride 98 - 111 mmol/L 104 104 102  CO2 22 - 32 mmol/L 35(H) 33(H) 30  Calcium 8.9 - 10.3 mg/dL 8.8(L) 8.5(L) 8.6(L)        Indwelling Urinary Catheter continued, requirement due to   Reason to continue Indwelling Urinary Catheter strict Intake/Output monitoring for hemodynamic instability         Ventilator continued, requirement due to severe respiratory failure   Ventilator Sedation RASS 0 to -2      ASSESSMENT AND PLAN SYNOPSIS 69 yo WM with acute NSTEMI with acute pulm edema with acute renal failure, failure to wean from vent due to active sCHF and edema and MI   Severe ACUTE Hypoxic and Hypercapnic Respiratory Failure -continue Full MV support -continue Bronchodilator Therapy -Wean Fio2 and PEEP as tolerated -will perform SAT/SBT when respiratory parameters are met -VAP/VENT bundle implementation  ACUTE SYSTOLIC CARDIAC FAILURE- EF -oxygen as needed -Lasix as tolerated -follow up cardiac enzymes as indicated -follow up cardiology recs   obesity, possible OSA.   Will certainly impact respiratory mechanics, ventilator weaning  NEUROLOGY - intubated and sedated - minimal sedation to achieve a RASS goal: -1 Wake up assessment pending  CARDIAC ICU monitoring  ID Dx aspiration pneumonia -continue IV abx as prescibed -follow up cultures  GI GI PROPHYLAXIS as indicated   DIET-->TF's as tolerated Constipation protocol as indicated  ENDO - will use ICU hypoglycemic\Hyperglycemia protocol if indicated     ELECTROLYTES -follow labs as needed -replace as needed -pharmacy consultation and following   DVT/GI PRX ordered and assessed TRANSFUSIONS AS NEEDED MONITOR FSBS I Assessed the need for Labs I Assessed the need for Foley I Assessed the need for Central Venous Line Family Discussion when available I  Assessed the need for Mobilization I made an Assessment of medications to be adjusted accordingly Safety Risk assessment completed   CASE DISCUSSED IN MULTIDISCIPLINARY ROUNDS WITH ICU TEAM  Critical Care Time devoted to patient care services described in this note is 35 minutes.   Overall, patient is critically ill, prognosis is guarded.  Patient with Multiorgan failure and at high risk for cardiac arrest and death.    Corrin Parker, M.D.  [facility]  Medical Director [place] Director [facility] Cardio-Pulmonary Department

## 2020-04-10 NOTE — Progress Notes (Signed)
End of shift summary:    Patient had been off of Fentanyl and Versed since approximately 1100.  Patient would get agitated but then fall back asleep.  Respiratory changed settings on ventilator and patient was doing some breathing on his own, however patient was not extubated this shift due to lethargy.  Medications changed from Fentanyl and Versed to Propofol at approximately 1800 to keep patient calm and from pulling out his tube overnight.  When patient does wake up he immediately reaches for his tube.  Family was at the bedside all day and were very comforting to the patient.  Vitals remained stable.  Patient did remove one of his IVs while moving around in the bed but adequate access is still maintained.

## 2020-04-10 NOTE — Progress Notes (Signed)
[facility] Encounter Note  Patient: Tashi Band / Admit Date: 04/06/2020 / Date of Encounter: 04/10/2020, 8:59 AM   Subjective: No apparent significant changes of condition .  Overall has had significant respiratory failure and multifactorial organ failure stroke with neurologic abnormalities and unable to protect airway now intubated on full respiratory ventilation.  Patient did have congestive heart failure as well causing hypoxia and exacerbation as well.  This includes issues of acute congestive heart failure with elevated BNP non-ST elevation myocardial infarction with elevated troponin abnormal EKG and chest x-ray showing pulmonary edema. This has been corrected with ventilatory support and lasix.   Echocardiogram has shown normal LV systolic function with ejection fraction of 50% and no apparent significant valvular heart disease contributing to above Patient has been on enoxaparin for deep venous thrombosis treatment as well as risk reduction in further myocardial infarction.  Telemetry has shown no evidence of rhythm disturbances. No further apparent ongoing cardiac concerns at this time Review of Systems: Cannot assess  Objective: Telemetry: Normal sinus rhythm Physical Exam: Blood pressure (!) 101/58, pulse 69, temperature (!) 97.5 F (36.4 C), temperature source Axillary, resp. rate 20, height 5' 9.02" (1.753 m), weight 91.1 kg, SpO2 93 %. Body mass index is 29.64 kg/m. General: Well developed, well nourished,  Head: Normocephalic, atraumatic, sclera non-icteric, no xanthomas, nares are without discharge. Neck: No apparent masses Lungs: Normal respirations with few wheezes, no rhonchi, no rales , some crackles   Heart: Regular rate and rhythm, normal S1 S2, no murmur, no rub, no gallop, PMI is normal size and placement, carotid upstroke normal without bruit, jugular venous pressure normal Abdomen: Soft, , non-distended with normoactive bowel sounds. No  hepatosplenomegaly. Abdominal aorta is normal size without bruit Extremities: 1+ edema, no clubbing, no cyanosis, no ulcers,  Peripheral: 2+ radial, 2+ femoral, 2+ dorsal pedal pulses Neuro: Not alert and oriented.  Does not moves all extremities spontaneously. Psych: Does not responds to questions appropriately with a normal affect.   Intake/Output Summary (Last 24 hours) at 04/10/2020 0859 Last data filed at 04/10/2020 9702 Gross per 24 hour  Intake 816.95 ml  Output 900 ml  Net -83.05 ml    Inpatient Medications:  .  stroke: mapping our early stages of recovery book   Does not apply Once  . atorvastatin  80 mg Per Tube Daily  . budesonide (PULMICORT) nebulizer solution  0.5 mg Nebulization BID  . chlorhexidine gluconate (MEDLINE KIT)  15 mL Mouth Rinse BID  . Chlorhexidine Gluconate Cloth  6 each Topical Daily  . docusate  100 mg Per Tube BID  . enoxaparin (LOVENOX) injection  40 mg Subcutaneous Q24H  . feeding supplement (PRO-STAT 64)  30 mL Per Tube Daily  . insulin aspart  0-9 Units Subcutaneous Q4H  . ipratropium-albuterol  3 mL Nebulization Q4H  . mouth rinse  15 mL Mouth Rinse 10 times per day  . methylPREDNISolone (SOLU-MEDROL) injection  20 mg Intravenous Q12H  . polyethylene glycol  17 g Per Tube Daily   Infusions:  . azithromycin Stopped (04/09/20 1725)  . cefTRIAXone (ROCEPHIN)  IV Stopped (04/09/20 1519)  . famotidine (PEPCID) IV 20 mg (04/09/20 2225)  . feeding supplement (VITAL AF 1.2 CAL) 1,000 mL (04/09/20 1123)  . fentaNYL infusion INTRAVENOUS 125 mcg/hr (04/10/20 0838)  . midazolam 2 mg/hr (04/10/20 0838)  . norepinephrine (LEVOPHED) Adult infusion Stopped (04/08/20 1712)    Labs: Recent Labs    04/09/20 0424 04/10/20 0400  NA 144 145  K 4.5 5.2*  CL 104 104  CO2 33* 35*  GLUCOSE 135* 134*  BUN 37* 38*  CREATININE 0.87 0.77  CALCIUM 8.5* 8.8*  MG 2.3 2.2  PHOS 2.5 3.5   Recent Labs    04/08/20 0451 04/09/20 0424  AST 47* 34  ALT 84* 70*   ALKPHOS 56 51  BILITOT 0.5 0.6  PROT 5.8* 5.5*  ALBUMIN 2.8* 2.8*   Recent Labs    04/09/20 0424 04/10/20 0400  WBC 10.2 10.4  NEUTROABS 9.0* 9.1*  HGB 14.7 14.9  HCT 44.7 47.8  MCV 92.5 96.2  PLT 236 228   No results for input(s): CKTOTAL, CKMB, TROPONINI in the last 72 hours. Invalid input(s): POCBNP No results for input(s): HGBA1C in the last 72 hours.   Weights: Filed Weights   04/06/20 1406 04/07/20 0024 04/10/20 0500  Weight: 83.9 kg 87.5 kg 91.1 kg     Radiology/Studies:  CT Head Wo Contrast  Result Date: 04/06/2020 CLINICAL DATA:  Increasing weakness, altered level of consciousness, difficulty speaking for several days EXAM: CT HEAD WITHOUT CONTRAST TECHNIQUE: Contiguous axial images were obtained from the base of the skull through the vertex without intravenous contrast. COMPARISON:  None. FINDINGS: Brain: There is a focal area of cortical hypodensity in the right frontal lobe, reference images 11-13 of series 3, which could reflect an area of acute or subacute infarct. No evidence of hemorrhage. Lateral ventricles and midline structures are unremarkable. No acute extra-axial fluid collections. No mass effect. Vascular: No hyperdense vessel or unexpected calcification. Skull: Normal. Negative for fracture or focal lesion. Sinuses/Orbits: No acute finding. Other: None. IMPRESSION: 1. Small focal cortical hypodensity right frontal lobe which may reflect acute or subacute cortical infarct. 2. No evidence of hemorrhage. Electronically Signed   By: Randa Ngo M.D.   On: 04/06/2020 15:41   MR BRAIN WO CONTRAST  Result Date: 04/07/2020 CLINICAL DATA:  Altered mental status. Worsening weakness and speech disturbance. EXAM: MRI HEAD WITHOUT CONTRAST TECHNIQUE: Multiplanar, multiecho pulse sequences of the brain and surrounding structures were obtained without intravenous contrast. COMPARISON:  Head CT yesterday. FINDINGS: Brain: Diffusion imaging does not show any acute or  subacute infarction. Brainstem and cerebellum are normal. Cerebral hemispheres show mild small vessel change the white matter. No cortical or large vessel territory infarction. No mass lesion, hemorrhage, hydrocephalus or extra-axial collection. There is cerebellar tonsillar extension through the foramen magnum of 1.3 Cm consistent with chronic Chiari malformation. No sign of upper cervical cord syrinx. Vascular: Major vessels at the base of the brain show flow. Skull and upper cervical spine: Negative Sinuses/Orbits: Clear/normal Other: None IMPRESSION: No acute finding by MRI. 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 Mild chronic small-vessel change of the cerebral hemispheric white matter, often seen at this age. Electronically Signed   By: Nelson Chimes M.D.   On: 04/07/2020 14:31   US Carotid Bilateral (at [facility] and [facility] only)  Result Date: 04/06/2020 CLINICAL DATA:  Stroke.  Hypertension, history of tobacco abuse EXAM: BILATERAL CAROTID DUPLEX ULTRASOUND TECHNIQUE: Pearline Cables scale imaging, color Doppler and duplex ultrasound were performed of bilateral carotid and vertebral arteries in the neck. COMPARISON:  None. FINDINGS: Criteria: Quantification of carotid stenosis is based on velocity parameters that correlate the residual internal carotid diameter with NASCET-based stenosis levels, using the diameter of the distal internal carotid lumen as the denominator for stenosis measurement. The following velocity measurements were obtained: RIGHT ICA: 109/45 cm/sec CCA: 66/19 cm/sec  SYSTOLIC ICA/CCA RATIO:  1.6 ECA: 172 cm/sec LEFT ICA: 95/41 cm/sec CCA: 19/16 cm/sec SYSTOLIC ICA/CCA RATIO:  1.7 ECA: 69 cm/sec RIGHT CAROTID ARTERY: Eccentric plaque in the bulb with only mild stenosis. Normal waveforms and color Doppler signal throughout. RIGHT VERTEBRAL ARTERY:  Normal flow direction and waveform. LEFT CAROTID ARTERY: Eccentric plaque in the bulb and ICA origin with  only mild stenosis. Normal waveforms and color Doppler signal throughout. LEFT VERTEBRAL ARTERY:  Normal flow direction and waveform. IMPRESSION: 1. Bilateral carotid bifurcation plaque resulting in less than 50% diameter ICA stenosis. 2. Antegrade bilateral vertebral arterial flow. Electronically Signed   By: Lucrezia Europe M.D.   On: 04/06/2020 21:08   US Venous Img Lower Bilateral (DVT)  Result Date: 04/06/2020 CLINICAL DATA:  Edema EXAM: BILATERAL LOWER EXTREMITY VENOUS DOPPLER ULTRASOUND TECHNIQUE: Gray-scale sonography with compression, as well as color and duplex ultrasound, were performed to evaluate the deep venous system(s) from the level of the common femoral vein through the popliteal and proximal calf veins. COMPARISON:  None. FINDINGS: VENOUS Normal compressibility of the common femoral, superficial femoral, and popliteal veins, as well as the visualized calf veins. Visualized portions of profunda femoral vein and great saphenous vein unremarkable. No filling defects to suggest DVT on grayscale or color Doppler imaging. Doppler waveforms show normal direction of venous flow, normal respiratory plasticity and response to augmentation. OTHER None. Limitations: none IMPRESSION: Negative. Electronically Signed   By: Constance Holster M.D.   On: 04/06/2020 18:57   DG Chest Port 1 View  Result Date: 04/09/2020 CLINICAL DATA:  Acute respiratory failure EXAM: PORTABLE CHEST 1 VIEW COMPARISON:  Three days ago FINDINGS: Endotracheal tube tip is just below the clavicular heads. The enteric tube tip reaches the stomach with side port near the GE junction. Normal heart size and mediastinal contours. No visible effusion or pneumothorax. Mild interstitial coarsening without Kerley lines. No consolidation. IMPRESSION: Stable hardware positioning. Interstitial prominence but no edema or focal pneumonia. Electronically Signed   By: Monte Fantasia M.D.   On: 04/09/2020 07:21   DG Chest Portable 1 View  Result  Date: 04/06/2020 CLINICAL DATA:  69 year old male intubated. EXAM: PORTABLE CHEST 1 VIEW COMPARISON:  Portable chest 1427 hours today. FINDINGS: Portable AP supine view at 2128 hours. Intubated and enteric tube in place. Endotracheal tube tip projects between the clavicles and carina. Enteric tube terminates in the left upper quadrant, side hole at the level of the proximal gastric body. Stable lung volumes and ventilation. Mildly increased pulmonary interstitial markings are stable and nonspecific. Normal cardiac size and mediastinal contours. Negative visible bowel gas pattern. No acute osseous abnormality identified. IMPRESSION: 1. Satisfactory ET tube and enteric tube placement. 2. Stable ventilation with mildly increased pulmonary interstitial markings. Electronically Signed   By: Genevie Ann M.D.   On: 04/06/2020 21:47   DG Chest Port 1 View  Result Date: 04/06/2020 CLINICAL DATA:  Short of breath, bilateral lower extremity edema, productive cough EXAM: PORTABLE CHEST 1 VIEW COMPARISON:  None. FINDINGS: 2 frontal views of the chest demonstrate an unremarkable cardiac silhouette. There is central vascular congestion without airspace disease, effusion, or pneumothorax. No acute bony abnormalities. IMPRESSION: 1. Central vascular congestion without overt edema. Electronically Signed   By: Randa Ngo M.D.   On: 04/06/2020 14:55   ECHOCARDIOGRAM COMPLETE  Result Date: 04/08/2020    ECHOCARDIOGRAM REPORT   Patient Name:   RODRIGO MCGRANAHAN Date of Exam: 04/07/2020 Medical Rec #:  [id number]  Height:       69.0 in Accession #:    [id number]       Weight:       192.9 lb Date of Birth:  1951/06/06         BSA:          2.035 m Patient Age:    71 years         BP:           103/57 mmHg Patient Gender: M                HR:           92 bpm. Exam Location:  [facility] Procedure: 2D Echo, Cardiac Doppler and Limited Color Doppler Indications:     I21.4 NSTEMI; I163.9 Stroke  History:         Patient has no prior  history of Echocardiogram examinations.                  Risk Factors:Hypertension.  Sonographer:     Charmayne Sheer RDCS (AE) Referring Phys:  [id number] [facility] Diagnosing Phys: Yolonda Kida MD  Sonographer Comments: Technically difficult study due to poor echo windows, no apical window, suboptimal parasternal window and echo performed with patient supine and on artificial respirator. IMPRESSIONS  1. Left ventricular ejection fraction, by estimation, is 50 to 55%. The left ventricle has normal function. The left ventricle has no regional wall motion abnormalities. Left ventricular diastolic parameters were normal.  2. Right ventricular systolic function is normal. The right ventricular size is normal.  3. The mitral valve is normal in structure. No evidence of mitral valve regurgitation.  4. The aortic valve is normal in structure. Aortic valve regurgitation is not visualized. FINDINGS  Left Ventricle: Left ventricular ejection fraction, by estimation, is 50 to 55%. The left ventricle has normal function. The left ventricle has no regional wall motion abnormalities. The left ventricular internal cavity size was normal in size. There is  no left ventricular hypertrophy. Left ventricular diastolic parameters were normal. Right Ventricle: The right ventricular size is normal. No increase in right ventricular wall thickness. Right ventricular systolic function is normal. Left Atrium: Left atrial size was normal in size. Right Atrium: Right atrial size was normal in size. Pericardium: There is no evidence of pericardial effusion. Mitral Valve: The mitral valve is normal in structure. No evidence of mitral valve regurgitation. Tricuspid Valve: The tricuspid valve is normal in structure. Tricuspid valve regurgitation is trivial. Aortic Valve: The aortic valve is normal in structure. Aortic valve regurgitation is not visualized. Pulmonic Valve: The pulmonic valve was normal in structure. Pulmonic valve regurgitation is  not visualized. Aorta: The aortic root is normal in size and structure. IAS/Shunts: No atrial level shunt detected by color flow Doppler.  LEFT VENTRICLE PLAX 2D LVIDd:         4.25 cm LVIDs:         3.19 cm LV PW:         1.00 cm LV IVS:        0.86 cm LVOT diam:     2.10 cm LVOT Area:     3.46 cm  LEFT ATRIUM         Index LA diam:    3.10 cm 1.52 cm/m                        PULMONIC VALVE AORTA  PV Vmax:       1.36 m/s Ao Root diam: 2.90 cm PV Vmean:      89.700 cm/s                       PV VTI:        0.233 m                       PV Peak grad:  7.4 mmHg                       PV Mean grad:  4.0 mmHg   SHUNTS Systemic Diam: 2.10 cm Dwayne D Callwood MD Electronically signed by Yolonda Kida MD Signature Date/Time: 04/08/2020/1:52:22 PM    Final      Assessment and Recommendation  69 y.o. male with heavy tobacco abuse hypertension hyperlipidemia with apparent neurologic event and   respiratory failure due to lack of protection of airway needing intubation and protection with acute congestive heart failure myocardial infarction pulmonary edema and kidney dysfunction.   Relatively stable from cardiac standpoint at this time 1.  Continue supportive care for acute respiratory failure due to above without restriction 2. .  No further cardiac diagnostics  And or intervention necessary at this time 3. Will sign off at this time. Call if need for further cardiac imput  Signed, Serafina Royals M.D. FACC

## 2020-04-11 LAB — CULTURE, BLOOD (ROUTINE X 2)
Culture: NO GROWTH
Culture: NO GROWTH
Special Requests: ADEQUATE
Special Requests: ADEQUATE

## 2020-04-11 LAB — GLUCOSE, CAPILLARY
Glucose-Capillary: 114 mg/dL — ABNORMAL HIGH (ref 70–99)
Glucose-Capillary: 134 mg/dL — ABNORMAL HIGH (ref 70–99)
Glucose-Capillary: 117 mg/dL — ABNORMAL HIGH (ref 70–99)
Glucose-Capillary: 125 mg/dL — ABNORMAL HIGH (ref 70–99)
Glucose-Capillary: 96 mg/dL (ref 70–99)
Glucose-Capillary: 96 mg/dL (ref 70–99)

## 2020-04-11 LAB — CBC WITH DIFFERENTIAL/PLATELET
Abs Immature Granulocytes: 0.05 10*3/uL (ref 0.00–0.07)
Basophils Absolute: 0 10*3/uL (ref 0.0–0.1)
Basophils Relative: 0 %
Eosinophils Absolute: 0 10*3/uL (ref 0.0–0.5)
Eosinophils Relative: 0 %
HCT: 49.1 % (ref 39.0–52.0)
Hemoglobin: 15.2 g/dL (ref 13.0–17.0)
Immature Granulocytes: 1 %
Lymphocytes Relative: 4 %
Lymphs Abs: 0.5 10*3/uL — ABNORMAL LOW (ref 0.7–4.0)
MCH: 30 pg (ref 26.0–34.0)
MCHC: 31 g/dL (ref 30.0–36.0)
MCV: 97 fL (ref 80.0–100.0)
Monocytes Absolute: 0.7 10*3/uL (ref 0.1–1.0)
Monocytes Relative: 7 %
Neutro Abs: 9.4 10*3/uL — ABNORMAL HIGH (ref 1.7–7.7)
Neutrophils Relative %: 88 %
Platelets: 241 10*3/uL (ref 150–400)
RBC: 5.06 MIL/uL (ref 4.22–5.81)
RDW: 13.8 % (ref 11.5–15.5)
WBC: 10.6 10*3/uL — ABNORMAL HIGH (ref 4.0–10.5)
nRBC: 0 % (ref 0.0–0.2)

## 2020-04-11 LAB — BASIC METABOLIC PANEL
Anion gap: 7 (ref 5–15)
Anion gap: 13 (ref 5–15)
BUN: 41 mg/dL — ABNORMAL HIGH (ref 8–23)
BUN: 31 mg/dL — ABNORMAL HIGH (ref 8–23)
CO2: 34 mmol/L — ABNORMAL HIGH (ref 22–32)
CO2: 31 mmol/L (ref 22–32)
Calcium: 8.7 mg/dL — ABNORMAL LOW (ref 8.9–10.3)
Calcium: 10 mg/dL (ref 8.9–10.3)
Chloride: 102 mmol/L (ref 98–111)
Chloride: 92 mmol/L — ABNORMAL LOW (ref 98–111)
Creatinine, Ser: 0.93 mg/dL (ref 0.61–1.24)
Creatinine, Ser: 0.79 mg/dL (ref 0.61–1.24)
GFR calc Af Amer: >60 mL/min (ref >60)
GFR calc Af Amer: >60 mL/min (ref >60)
GFR calc non Af Amer: >60 mL/min (ref >60)
GFR calc non Af Amer: >60 mL/min (ref >60)
Glucose, Bld: 144 mg/dL — ABNORMAL HIGH (ref 70–99)
Glucose, Bld: 94 mg/dL (ref 70–99)
Potassium: 6.7 mmol/L (ref 3.5–5.1)
Potassium: 5.6 mmol/L — ABNORMAL HIGH (ref 3.5–5.1)
Sodium: 143 mmol/L (ref 135–145)
Sodium: 136 mmol/L (ref 135–145)

## 2020-04-11 LAB — PHOSPHORUS: Phosphorus: 5.1 mg/dL — ABNORMAL HIGH (ref 2.5–4.6)

## 2020-04-11 LAB — BLOOD GAS, ARTERIAL
Acid-Base Excess: 13.5 mmol/L — ABNORMAL HIGH (ref 0.0–2.0)
Allens test (pass/fail): POSITIVE — AB
Bicarbonate: 40.9 mmol/L — ABNORMAL HIGH (ref 20.0–28.0)
FIO2: 0.28
O2 Saturation: 93.9 %
PEEP: 5 cmH2O
Patient temperature: 37
Pressure support: 5 cmH2O
pCO2 arterial: 63 mmHg — ABNORMAL HIGH (ref 32.0–48.0)
pH, Arterial: 7.42 (ref 7.350–7.450)
pO2, Arterial: 69 mmHg — ABNORMAL LOW (ref 83.0–108.0)

## 2020-04-11 LAB — POTASSIUM
Potassium: 6.6 mmol/L (ref 3.5–5.1)
Potassium: 6.2 mmol/L — ABNORMAL HIGH (ref 3.5–5.1)

## 2020-04-11 LAB — MAGNESIUM: Magnesium: 2.2 mg/dL (ref 1.7–2.4)

## 2020-04-11 MED ORDER — ORAL CARE MOUTH RINSE
15.0000 mL | Freq: Two times a day (BID) | OROMUCOSAL | Status: DC
  Administered 2020-04-12 – 2020-04-15 (×5): 15 mL via OROMUCOSAL

## 2020-04-11 MED ORDER — SODIUM ZIRCONIUM CYCLOSILICATE 5 G PO PACK: 10.0000 g | PACK | Freq: Once | ORAL | Status: DC

## 2020-04-11 MED ORDER — FUROSEMIDE 10 MG/ML IJ SOLN: 40.0000 mg | Freq: Once | INTRAMUSCULAR | Status: DC

## 2020-04-11 MED ORDER — PATIROMER SORBITEX CALCIUM 8.4 G PO PACK
16.8000 g | PACK | Freq: Every day | ORAL | Status: AC
  Administered 2020-04-11: 16.8 g via ORAL
  Filled 2020-04-11: qty 2

## 2020-04-11 MED ORDER — CALCIUM GLUCONATE-NACL 1-0.675 GM/50ML-% IV SOLN
1.0000 g | Freq: Once | INTRAVENOUS | Status: AC
  Administered 2020-04-11: 1000 mg via INTRAVENOUS
  Filled 2020-04-11: qty 50

## 2020-04-11 MED ORDER — INSULIN REGULAR HUMAN 100 UNIT/ML IJ SOLN
10.0000 [IU] | Freq: Once | INTRAMUSCULAR | Status: AC
  Administered 2020-04-11: 10 [IU] via INTRAVENOUS
  Filled 2020-04-11: qty 3

## 2020-04-11 MED ORDER — ALBUTEROL SULFATE (2.5 MG/3ML) 0.083% IN NEBU
2.5000 mg | INHALATION_SOLUTION | Freq: Once | RESPIRATORY_TRACT | Status: AC
  Administered 2020-04-11: 2.5 mg via RESPIRATORY_TRACT
  Filled 2020-04-11: qty 3

## 2020-04-11 MED ORDER — SODIUM ZIRCONIUM CYCLOSILICATE 5 G PO PACK
10.0000 g | PACK | Freq: Once | ORAL | Status: AC
  Administered 2020-04-12: 10 g via ORAL
  Filled 2020-04-11: qty 2

## 2020-04-11 MED ORDER — FUROSEMIDE 10 MG/ML IJ SOLN
40.0000 mg | Freq: Once | INTRAMUSCULAR | Status: AC
  Administered 2020-04-11: 40 mg via INTRAVENOUS
  Filled 2020-04-11: qty 4

## 2020-04-11 MED ORDER — BISACODYL 10 MG RE SUPP
10.0000 mg | Freq: Once | RECTAL | Status: AC
  Administered 2020-04-11: 10 mg via RECTAL
  Filled 2020-04-11: qty 1

## 2020-04-11 MED ORDER — DEXTROSE 50 % IV SOLN
1.0000 | Freq: Once | INTRAVENOUS | Status: AC
  Administered 2020-04-11: 50 mL via INTRAVENOUS
  Filled 2020-04-11: qty 50

## 2020-04-11 NOTE — Progress Notes (Deleted)
This note also relates to the following rows which could not be included:  SpO2 - Cannot attach notes to unvalidated device data

## 2020-04-11 NOTE — Progress Notes (Signed)
CRITICAL CARE NOTE  69 yo male admitted with acute vs. subacute cortical infarct, elevated troponin's, and acute hypercapnic respiratory failure secondary to vascular congestion and suspected undiagnosed COPD exacerbation requiring mechanical intubation  SIGNIFICANT EVENTS/STUDIES: 07/11: Pt presented to [facility] ER with AMS and acute respiratory failure  07/11: CT Head revealed small focal cortical hypodensity right frontal lobe which may reflect acute or subacute cortical infarct. No evidence of hemorrhage.Neurology consulted recommended MR Brain 07/11: US Venous BLE negative for DVT 07/11: Pt initially admitted to the stepdown unit, however while awaiting for stepdown bed availability he decompensated secondary to respiratory acidosis requiring mechanical intubation and transfer to ICU 7/12 multiorgan failure DNR/DNI 7/13 MRI reports are NEG for acute CVA 7/14 plan for SAT/SBT today 7/14 failed SAT/SBT , family at bedisde witnessed 7/15 failed SAT/SBT, family at bedside witnessed   CC  follow up respiratory failure  SUBJECTIVE Patient remains critically ill Prognosis is guarded   BP 106/62   Pulse 92   Temp 98.3 F (36.8 C) (Axillary)   Resp 16   Ht 5' 9.02" (1.753 m)   Wt 87.2 kg   SpO2 96%   BMI 28.38 kg/m    I/O last 3 completed shifts: In: 3480.6 [I.V.:403; NG/GT:2522.7; IV Piggyback:554.9] Out: 2555 [Urine:2555] No intake/output data recorded.  SpO2: 96 % O2 Flow Rate (L/min): 4 L/min FiO2 (%): 40 %  Estimated body mass index is 28.38 kg/m as calculated from the following:   Height as of this encounter: 5' 9.02" (1.753 m).   Weight as of this encounter: 87.2 kg.  SIGNIFICANT EVENTS   REVIEW OF SYSTEMS  PATIENT IS UNABLE TO PROVIDE COMPLETE REVIEW OF SYSTEMS DUE TO SEVERE CRITICAL ILLNESS        PHYSICAL EXAMINATION:  GENERAL:critically ill appearing, +resp distress HEAD: Normocephalic, atraumatic.  EYES: Pupils equal, round, reactive to  light.  No scleral icterus.  MOUTH: Moist mucosal membrane. NECK: Supple.  PULMONARY: +rhonchi, +wheezing CARDIOVASCULAR: S1 and S2. Regular rate and rhythm. No murmurs, rubs, or gallops.  GASTROINTESTINAL: Soft, nontender, -distended.  Positive bowel sounds.   MUSCULOSKELETAL: No swelling, clubbing, or edema.  NEUROLOGIC: obtunded, GCS<8 SKIN:intact,warm,dry  MEDICATIONS: I have reviewed all medications and confirmed regimen as documented   CULTURE RESULTS   Recent Results (from the past 240 hour(s))  SARS Coronavirus 2 by RT PCR (hospital order, performed in [facility] hospital lab) Nasopharyngeal Nasopharyngeal Swab     Status: None   Collection Time: 04/06/20  2:09 PM   Specimen: Nasopharyngeal Swab  Result Value Ref Range Status   SARS Coronavirus 2 NEGATIVE NEGATIVE Final    Comment: (NOTE) SARS-CoV-2 target nucleic acids are NOT DETECTED.  The SARS-CoV-2 RNA is generally detectable in upper and lower respiratory specimens during the acute phase of infection. The lowest concentration of SARS-CoV-2 viral copies this assay can detect is 250 copies / mL. A negative result does not preclude SARS-CoV-2 infection and should not be used as the sole basis for treatment or other patient management decisions.  A negative result may occur with improper specimen collection / handling, submission of specimen other than nasopharyngeal swab, presence of viral mutation(s) within the areas targeted by this assay, and inadequate number of viral copies (<250 copies / mL). A negative result must be combined with clinical observations, patient history, and epidemiological information.  Fact Sheet for Patients:   StrictlyIdeas.no  Fact Sheet for Healthcare Providers: BankingDealers.co.za  This test is not yet approved or  cleared by the [place] FDA  and has been authorized for detection and/or diagnosis of SARS-CoV-2 by FDA under an  Emergency Use Authorization (EUA).  This EUA will remain in effect (meaning this test can be used) for the duration of the COVID-19 declaration under Section 564(b)(1) of the Act, 21 U.S.C. section 360bbb-3(b)(1), unless the authorization is terminated or revoked sooner.  Performed at [facility], [address] [place], [place] [address]   Blood culture (routine x 2)     Status: None   Collection Time: 04/06/20  3:32 PM   Specimen: BLOOD  Result Value Ref Range Status   Specimen Description BLOOD RIGHT ANTECUBITAL  Final   Special Requests   Final    BOTTLES DRAWN AEROBIC AND ANAEROBIC Blood Culture adequate volume   Culture   Final    NO GROWTH 5 DAYS Performed at [facility], [address] [place], [place] [address]    Report Status 04/11/2020 FINAL  Final  Blood culture (routine x 2)     Status: None   Collection Time: 04/06/20  3:37 PM   Specimen: BLOOD  Result Value Ref Range Status   Specimen Description BLOOD LEFT ANTECUBITAL  Final   Special Requests   Final    BOTTLES DRAWN AEROBIC AND ANAEROBIC Blood Culture adequate volume   Culture   Final    NO GROWTH 5 DAYS Performed at [facility], [address] [place], [place] [address]    Report Status 04/11/2020 FINAL  Final  MRSA PCR Screening     Status: None   Collection Time: 04/07/20 12:43 AM   Specimen: Nasal Mucosa; Nasopharyngeal  Result Value Ref Range Status   MRSA by PCR NEGATIVE NEGATIVE Final    Comment:        The GeneXpert MRSA Assay (FDA approved for NASAL specimens only), is one component of a comprehensive MRSA colonization surveillance program. It is not intended to diagnose MRSA infection nor to guide or monitor treatment for MRSA infections. Performed at [facility], [address] [place], [place] [address]           IMAGING    No results found.   Nutrition Status: Nutrition Problem: Inadequate oral intake Etiology: inability to  eat (pt sedated and ventilated) Signs/Symptoms: NPO status       Indwelling Urinary Catheter continued, requirement due to   Reason to continue Indwelling Urinary Catheter strict Intake/Output monitoring for hemodynamic instability          Ventilator continued, requirement due to severe respiratory failure   Ventilator Sedation RASS 0 to -2      ASSESSMENT AND PLAN SYNOPSIS  69 yo WM with acute NSTEMI with acute pulm edema with acute renal failure, failure to wean from vent due to active sCHF and edema and MI   Severe ACUTE Hypoxic and Hypercapnic Respiratory Failure -continue Full MV support -continue Bronchodilator Therapy -Wean Fio2 and PEEP as tolerated -will perform SAT/SBT when respiratory parameters are met -VAP/VENT bundle implementation  ACUTE DIASTOLIC CARDIAC FAILURE-  -oxygen as needed -Lasix as tolerated -follow up cardiac enzymes as indicated -follow up cardiology recs   obesity, possible OSA.   Will certainly impact respiratory mechanics, ventilator weaning    NEUROLOGY - intubated and sedated - minimal sedation to achieve a RASS goal: -1 Wake up assessment pending   CARDIAC ICU monitoring  ID DX of aspiration pneumonia -continue IV abx as prescibed -follow up cultures  GI GI PROPHYLAXIS as indicated  DIET-->TF's as tolerated Constipation protocol  as indicated  ENDO - will use ICU hypoglycemic\Hyperglycemia protocol if indicated     ELECTROLYTES -follow labs as needed -replace as needed -pharmacy consultation and following   DVT/GI PRX ordered and assessed TRANSFUSIONS AS NEEDED MONITOR FSBS I Assessed the need for Labs I Assessed the need for Foley I Assessed the need for Central Venous Line Family Discussion when available I Assessed the need for Mobilization I made an Assessment of medications to be adjusted accordingly Safety Risk assessment completed   CASE DISCUSSED IN MULTIDISCIPLINARY ROUNDS WITH ICU  TEAM  Critical Care Time devoted to patient care services described in this note is 32 minutes.   Overall, patient is critically ill, prognosis is guarded.     Corrin Parker, M.D.  [facility]  Medical Director [place] Director [facility] Cardio-Pulmonary Department

## 2020-04-11 NOTE — Progress Notes (Signed)
Pt extubated at 34 with RN and RT bedside. Pt able to state name and is slightly hoarse. Pt still slightly drowsy. Pt SPO2 96% on 6LNC. Pt weaned to [facility] by RT. Pt tolerating weaning. Family bedside. Pt remains DNR.

## 2020-04-11 NOTE — Progress Notes (Signed)
Pt. Extubated and placed on 1l [place],hr 92,sat 93,rr 26.

## 2020-04-11 NOTE — Progress Notes (Signed)
Brief note for transfer of care from PCCM to [facility] service:  Dakota Dunn is a 69 y.o. male with medical history of hypertension, tobacco abuse who was admitted on 04/06/2020 after presenting with altered mental status and respiratory failure with hypoxia and hypercapnea.  He was found volume overloaded, with elevated troponin.  CT head showed small focal cortical hypodensity in the right frontal lobe - acute vs subacute.  MRI was negative for acute findings.  Neurology signed off.      On day after admission, patient's respiratory status worsened, requiring transfer to the ICU and intubation and mechanical ventilation and pressors for BP support during diuresis.  Elevated troponin thought likely demand ischemia in setting of respiratory failure.      Family consented to DNR/DNI status while in ICU, given multisystem organ failure.    Patient was extubated earlier today after multiple failed SBT's previous days.  Remains confused but alert and oriented, conversing with family.   Has been too weak to work with therapy or SLP up to this point.   TRH will assume care of patient tomorrow, 7/17.

## 2020-04-12 DIAGNOSIS — I5031 Acute diastolic (congestive) heart failure: Secondary | ICD-10-CM

## 2020-04-12 DIAGNOSIS — J9602 Acute respiratory failure with hypercapnia: Secondary | ICD-10-CM

## 2020-04-12 LAB — MAGNESIUM: Magnesium: 1.6 mg/dL — ABNORMAL LOW (ref 1.7–2.4)

## 2020-04-12 LAB — BASIC METABOLIC PANEL
Anion gap: 15 (ref 5–15)
BUN: 34 mg/dL — ABNORMAL HIGH (ref 8–23)
CO2: 29 mmol/L (ref 22–32)
Calcium: 10.2 mg/dL (ref 8.9–10.3)
Chloride: 91 mmol/L — ABNORMAL LOW (ref 98–111)
Creatinine, Ser: 0.88 mg/dL (ref 0.61–1.24)
GFR calc Af Amer: >60 mL/min (ref >60)
GFR calc non Af Amer: >60 mL/min (ref >60)
Glucose, Bld: 94 mg/dL (ref 70–99)
Potassium: 5.4 mmol/L — ABNORMAL HIGH (ref 3.5–5.1)
Sodium: 135 mmol/L (ref 135–145)

## 2020-04-12 LAB — CBC WITH DIFFERENTIAL/PLATELET
Abs Immature Granulocytes: 0.06 10*3/uL (ref 0.00–0.07)
Basophils Absolute: 0 10*3/uL (ref 0.0–0.1)
Basophils Relative: 0 %
Eosinophils Absolute: 0 10*3/uL (ref 0.0–0.5)
Eosinophils Relative: 0 %
HCT: 55.8 % — ABNORMAL HIGH (ref 39.0–52.0)
Hemoglobin: 17.7 g/dL — ABNORMAL HIGH (ref 13.0–17.0)
Immature Granulocytes: 0 %
Lymphocytes Relative: 9 %
Lymphs Abs: 1.2 10*3/uL (ref 0.7–4.0)
MCH: 29.7 pg (ref 26.0–34.0)
MCHC: 31.7 g/dL (ref 30.0–36.0)
MCV: 93.8 fL (ref 80.0–100.0)
Monocytes Absolute: 1.3 10*3/uL — ABNORMAL HIGH (ref 0.1–1.0)
Monocytes Relative: 10 %
Neutro Abs: 10.8 10*3/uL — ABNORMAL HIGH (ref 1.7–7.7)
Neutrophils Relative %: 81 %
Platelets: 264 10*3/uL (ref 150–400)
RBC: 5.95 MIL/uL — ABNORMAL HIGH (ref 4.22–5.81)
RDW: 13 % (ref 11.5–15.5)
WBC: 13.4 10*3/uL — ABNORMAL HIGH (ref 4.0–10.5)
nRBC: 0 % (ref 0.0–0.2)

## 2020-04-12 LAB — GLUCOSE, CAPILLARY
Glucose-Capillary: 92 mg/dL (ref 70–99)
Glucose-Capillary: 103 mg/dL — ABNORMAL HIGH (ref 70–99)
Glucose-Capillary: 102 mg/dL — ABNORMAL HIGH (ref 70–99)
Glucose-Capillary: 129 mg/dL — ABNORMAL HIGH (ref 70–99)
Glucose-Capillary: 121 mg/dL — ABNORMAL HIGH (ref 70–99)

## 2020-04-12 LAB — PHOSPHORUS: Phosphorus: 2.7 mg/dL (ref 2.5–4.6)

## 2020-04-12 MED ORDER — DEXTROSE 50 % IV SOLN
50.0000 mL | Freq: Once | INTRAVENOUS | Status: AC
  Administered 2020-04-12: 50 mL via INTRAVENOUS
  Filled 2020-04-12: qty 50

## 2020-04-12 MED ORDER — INSULIN REGULAR HUMAN 100 UNIT/ML IJ SOLN
10.0000 [IU] | Freq: Once | INTRAMUSCULAR | Status: AC
  Administered 2020-04-12: 10 [IU] via INTRAVENOUS
  Filled 2020-04-12: qty 3

## 2020-04-12 NOTE — Plan of Care (Signed)
  Problem: Education: Goal: Knowledge of General Education information will improve Description: Including pain rating scale, medication(s)/side effects and non-pharmacologic comfort measures Outcome: Progressing   Problem: Health Behavior/Discharge Planning: Goal: Ability to manage health-related needs will improve Outcome: Progressing   Problem: Clinical Measurements: Goal: Ability to maintain clinical measurements within normal limits will improve Outcome: Progressing Goal: Will remain free from infection Outcome: Progressing Goal: Diagnostic test results will improve Outcome: Progressing Goal: Respiratory complications will improve Outcome: Progressing Goal: Cardiovascular complication will be avoided Outcome: Progressing   Problem: Activity: Goal: Risk for activity intolerance will decrease Outcome: Progressing   Problem: Coping: Goal: Level of anxiety will decrease Outcome: Progressing   Problem: Elimination: Goal: Will not experience complications related to bowel motility Outcome: Progressing Goal: Will not experience complications related to urinary retention Outcome: Progressing   Problem: Pain Managment: Goal: General experience of comfort will improve Outcome: Progressing   Problem: Safety: Goal: Ability to remain free from injury will improve Outcome: Progressing   Problem: Skin Integrity: Goal: Risk for impaired skin integrity will decrease Outcome: Progressing   Problem: Education: Goal: Knowledge of disease or condition will improve Outcome: Progressing Goal: Knowledge of the prescribed therapeutic regimen will improve Outcome: Progressing Goal: Individualized Educational Video(s) Outcome: Progressing   Problem: Activity: Goal: Ability to tolerate increased activity will improve Outcome: Progressing Goal: Will verbalize the importance of balancing activity with adequate rest periods Outcome: Progressing   Problem: Respiratory: Goal:  Ability to maintain a clear airway will improve Outcome: Progressing Goal: Levels of oxygenation will improve Outcome: Progressing Goal: Ability to maintain adequate ventilation will improve Outcome: Progressing   Problem: Education: Goal: Understanding of discharge needs will improve Outcome: Progressing   Problem: Bowel/Gastric: Goal: Gastrointestinal status for postoperative course will improve Outcome: Progressing   Problem: Clinical Measurements: Goal: Postoperative complications will be avoided or minimized Outcome: Progressing   Problem: Skin Integrity: Goal: Demonstration of wound healing without infection will improve Outcome: Progressing

## 2020-04-12 NOTE — Progress Notes (Signed)
PROGRESS NOTE    Dakota Dunn  OZD:[id number] DOB: [birth year] DOA: 04/06/2020 PCP: Patient, No Pcp Per   Brief Narrative:  69 yo male admitted with acute vs. subacute cortical infarct, elevated troponin's, and acute hypercapnic respiratory failure secondary to vascular congestion and suspected undiagnosed COPD exacerbation requiring mechanical intubation 07/11: Pt presented to [facility] ER with AMS and acute respiratory failure  07/11: CT Head revealed small focal cortical hypodensity right frontal lobe which may reflect acute or subacute cortical infarct. No evidence of hemorrhage.Neurology consulted recommended MR Brain 07/11: US Venous BLE negative for DVT 07/11: Pt initially admitted to the stepdown unit, however while awaiting for stepdown bed availability he decompensated secondary to respiratory acidosis requiring mechanical intubation and transfer to ICU 7/12 multiorgan failure DNR/DNI 7/13 MRI reports are NEG for acute CVA 7/14 plan for SAT/SBT today 7/14 failed SAT/SBT , family at bedisde witnessed 7/15 failed SAT/SBT, family at bedside witnessed 7/16: Extubated, care transferred to hospitalist service 7/17: Patient seen and examined in ICU. Respiratory status markedly improved. Mental status also improved.  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  Status post extubation day #1.  Respiratory status improved but remains tenuous.  Will need likely another 48 hours of inpatient monitoring and treatment prior to disposition planning   Consultants:   Cards- signed off  Procedures:   Endotracheal intubation, now extubated 04/11/2020  Antimicrobials:   None   Subjective: Seen and examined.  Appears deconditioned and weak but overall stable.  Objective: Vitals:   04/12/20 0800 04/12/20 1000 04/12/20 1100 04/12/20 1200  BP: 139/89 (!) 161/98 (!) 156/108 (!) 129/93  Pulse: 88 (!) 110 86 91  Resp: (!) 23 (!) 30 (!) 29 (!) 28  Temp: 97.9 F (36.6 C)   98.2 F (36.8 C)  TempSrc: Oral   Oral  SpO2: 94% (!) 87% 90% 93%  Weight:      Height:        Intake/Output Summary (Last 24 hours) at 04/12/2020 1408 Last data filed at 04/12/2020 1200 Gross per 24 hour  Intake 646.88 ml  Output 5905 ml  Net -5258.12 ml   Filed Weights   04/10/20 0500 04/11/20 0247 04/12/20 0334  Weight: 91.1 kg 87.2 kg 80.2 kg    Examination:  General exam: Appears weak and deconditioned Respiratory system: Normal work of breathing.  Scattered crackles Cardiovascular system: S1 & S2 heard, RRR. No JVD, murmurs, rubs, gallops or clicks. No pedal edema. Gastrointestinal system: Abdomen  is nondistended, soft and nontender. No organomegaly or masses felt. Normal bowel sounds heard. Central nervous system: Alert and oriented. No focal neurological deficits. Extremities: Symmetric 5 x 5 power. Skin: No rashes, lesions or ulcers Psychiatry: Judgement and insight appear normal. Mood & affect appropriate.      Data Reviewed: I have personally reviewed following labs and imaging studies  CBC: Recent Labs  Lab 04/08/20 0451 04/09/20 0424 04/10/20 0400 04/11/20 0400 04/12/20 0507  WBC 9.8 10.2 10.4 10.6* 13.4*  NEUTROABS 8.9* 9.0* 9.1* 9.4* 10.8*  HGB 14.8 14.7 14.9 15.2 17.7*  HCT 46.8 44.7 47.8 49.1 55.8*  MCV 94.0 92.5 96.2 97.0 93.8  PLT 256 236 228 241 297   Basic Metabolic Panel: Recent Labs  Lab 04/08/20 0451 04/08/20 0451 04/09/20 0424 04/09/20 0424 04/10/20 0400 04/10/20 0400 04/11/20 0400 04/11/20 0904 04/11/20 1351 04/11/20 2122 04/12/20 0507  NA 141   < > 144  --  145  --  143  --   --  136 135  K 4.2   < > 4.5   < > 5.2*   < > 6.7* 6.6* 6.2* 5.6* 5.4*  CL 102   < > 104  --  104  --  102  --   --  92* 91*  CO2 30   < > 33*  --  35*  --  34*  --   --  31 29  GLUCOSE 153*   < > 135*  --  134*  --  144*  --   --  94 94  BUN 37*   < > 37*  --  38*  --  41*  --   --  31* 34*  CREATININE 0.88   < > 0.87  --  0.77  --  0.93  --   --  0.79 0.88  CALCIUM 8.6*   < > 8.5*  --  8.8*  --  8.7*  --   --  10.0 10.2  MG 2.3  --  2.3  --  2.2  --  2.2  --   --   --  1.6*  PHOS 2.5  --  2.5  --  3.5  --  5.1*  --   --   --  2.7   < > = values in this interval not displayed.   GFR: Estimated Creatinine Clearance: 79.2 mL/min (by C-G formula based on SCr of 0.88 mg/dL). Liver Function Tests: Recent Labs  Lab 04/06/20 1409 04/07/20 0419 04/08/20 0451 04/09/20 0424  AST 109* 69* 47* 34  ALT 132* 98* 84* 70*  ALKPHOS 80 64 56 51  BILITOT 0.5 0.6 0.5 0.6  PROT 7.4 6.0* 5.8* 5.5*  ALBUMIN 3.7 3.0* 2.8* 2.8*   No results for input(s): LIPASE, AMYLASE in the last 168 hours. No results for input(s): AMMONIA in the last 168 hours. Coagulation Profile: No results for input(s): INR, PROTIME in the last 168 hours. Cardiac Enzymes: No results for input(s): CKTOTAL, CKMB, CKMBINDEX, TROPONINI in the last 168 hours. BNP (last 3 results) No results for input(s): PROBNP in the  last 8760 hours. HbA1C: No results for input(s): HGBA1C in the last 72 hours. CBG: Recent Labs  Lab 04/11/20 1923 04/11/20 2328 04/12/20 0320 04/12/20 0731 04/12/20 1108  GLUCAP 96 96 92 103* 102*   Lipid Profile: Recent Labs    04/10/20 1808  TRIG 60   Thyroid Function Tests: No results for input(s): TSH, T4TOTAL, FREET4, T3FREE, THYROIDAB in the last  72 hours. Anemia Panel: No results for input(s): VITAMINB12, FOLATE, FERRITIN, TIBC, IRON, RETICCTPCT in the last 72 hours. Sepsis Labs: Recent Labs  Lab 04/06/20 1409 04/07/20 0419 04/08/20 0451  PROCALCITON 0.62 0.29 0.10  LATICACIDVEN 1.6  --   --     Recent Results (from the past 240 hour(s))  SARS Coronavirus 2 by RT PCR (hospital order, performed in [facility] hospital lab) Nasopharyngeal Nasopharyngeal Swab     Status: None   Collection Time: 04/06/20  2:09 PM   Specimen: Nasopharyngeal Swab  Result Value Ref Range Status   SARS Coronavirus 2 NEGATIVE NEGATIVE Final    Comment: (NOTE) SARS-CoV-2 target nucleic acids are NOT DETECTED.  The SARS-CoV-2 RNA is generally detectable in upper and lower respiratory specimens during the acute phase of infection. The lowest concentration of SARS-CoV-2 viral copies this assay can detect is 250 copies / mL. A negative result does not preclude SARS-CoV-2 infection and should not be used as the sole basis for treatment or other patient management decisions.  A negative result may occur with improper specimen collection / handling, submission of specimen other than nasopharyngeal swab, presence of viral mutation(s) within the areas targeted by this assay, and inadequate number of viral copies (<250 copies / mL). A negative result must be combined with clinical observations, patient history, and epidemiological information.  Fact Sheet for Patients:   StrictlyIdeas.no  Fact Sheet for Healthcare  Providers: BankingDealers.co.za  This test is not yet approved or  cleared by the [place] FDA and has been authorized for detection and/or diagnosis of SARS-CoV-2 by FDA under an Emergency Use Authorization (EUA).  This EUA will remain in effect (meaning this test can be used) for the duration of the COVID-19 declaration under Section 564(b)(1) of the Act, 21 U.S.C. section 360bbb-3(b)(1), unless the authorization is terminated or revoked sooner.  Performed at [facility], [address] [place], [place] [address]   Blood culture (routine x 2)     Status: None   Collection Time: 04/06/20  3:32 PM   Specimen: BLOOD  Result Value Ref Range Status   Specimen Description BLOOD RIGHT ANTECUBITAL  Final   Special Requests   Final    BOTTLES DRAWN AEROBIC AND ANAEROBIC Blood Culture adequate volume   Culture   Final    NO GROWTH 5 DAYS Performed at [facility], [address], [place], [place] [address]    Report Status 04/11/2020 FINAL  Final  Blood culture (routine x 2)     Status: None   Collection Time: 04/06/20  3:37 PM   Specimen: BLOOD  Result Value Ref Range Status   Specimen Description BLOOD LEFT ANTECUBITAL  Final   Special Requests   Final    BOTTLES DRAWN AEROBIC AND ANAEROBIC Blood Culture adequate volume   Culture   Final    NO GROWTH 5 DAYS Performed at [facility], [address] [place], [place] [address]    Report Status 04/11/2020 FINAL  Final  MRSA PCR Screening     Status: None   Collection Time: 04/07/20 12:43 AM   Specimen: Nasal Mucosa; Nasopharyngeal  Result Value Ref Range Status   MRSA by PCR NEGATIVE NEGATIVE Final    Comment:        The GeneXpert MRSA Assay (FDA approved for NASAL specimens only), is one component of a comprehensive MRSA colonization surveillance program. It is not intended to diagnose MRSA infection nor to guide or monitor treatment for MRSA  infections. Performed  at [facility], [address] [place], [place] [address]          Radiology Studies: No results found.      Scheduled Meds: .  stroke: mapping our early stages of recovery book   Does not apply Once  . atorvastatin  80 mg Per Tube Daily  . budesonide (PULMICORT) nebulizer solution  0.5 mg Nebulization BID  . Chlorhexidine Gluconate Cloth  6 each Topical Daily  . docusate  100 mg Per Tube BID  . enoxaparin (LOVENOX) injection  40 mg Subcutaneous Q24H  . feeding supplement (PRO-STAT 64)  30 mL Per Tube Daily  . furosemide  40 mg Intravenous Once  . insulin aspart  0-9 Units Subcutaneous Q4H  . ipratropium-albuterol  3 mL Nebulization Q4H  . mouth rinse  15 mL Mouth Rinse BID  . methylPREDNISolone (SOLU-MEDROL) injection  20 mg Intravenous Q12H  . polyethylene glycol  17 g Per Tube Daily   Continuous Infusions: . famotidine (PEPCID) IV Stopped (04/12/20 1107)  . feeding supplement (VITAL AF 1.2 CAL) Stopped (04/11/20 0700)  . fentaNYL infusion INTRAVENOUS Stopped (04/12/20 1037)  . propofol (DIPRIVAN) infusion Stopped (04/11/20 0925)     LOS: 6 days    Time spent: 25 minutes    Sidney Ace, MD [facility] Pager [phone]  If 7PM-7AM, please contact night-coverage 04/12/2020, 2:08 PM

## 2020-04-12 NOTE — Evaluation (Signed)
Clinical/Bedside Swallow Evaluation Patient Details  Name: Dakota Dunn MRN: [id number] Date of Birth: 09-18-1951  Today's Date: 04/12/2020 Time: SLP Start Time (ACUTE ONLY): 0810 SLP Stop Time (ACUTE ONLY): 0910 SLP Time Calculation (min) (ACUTE ONLY): 60 min  Past Medical History:  Past Medical History:  Diagnosis Date  . Hypertension    Past Surgical History: History reviewed. No pertinent surgical history. HPI:  Pt is a 69 y.o. male with medical history significant of HTN, Tobacco abuse, not dx'd w/ COPD per chart presented with altered MS , speech changes, and stumbling gait per wife. Per wife for the past couple of days patient has been acting differently but unable to exactly tell me how.  She does report when he was trying to talk while watching TV with her, but was coming out of his mouth was different than what he meant.  No slurring of speech.  She felt this was worse today.  Also noticed him with a stumbling type gait last couple of days.  Family called EMS and patient was found to be satting in 78% on room air and was placed on 4 L of oxygen.  He does report having worsening leg swelling that started a few days ago as well.  Denies ever having this previously.  Patient did have a chest x-ray showing pulmonary vascular congestion consistent with congestive heart failure.  Pt was placed on BiPAP then required oral intubation d/t respiratory decline secondary to severe respiratory acidosis requiring mechanical intubation and ICU transfer.; he was extubated on 04/11/2020.  Remains confused but alert and conversing with others.  No acute finding by MRI. Chiari malformation noted.    Assessment / Plan / Recommendation Clinical Impression  Pt appears to present w/ oropharyngeal phase dysphagia in light of declined mental status, and recent Pulmonary decline requiring oral intubation for ~5 days; extubated on 04/11/2020. These factors impact his overall awareness/engagement and safety during  po taskswhich increases risk for aspiration. Pt's risk for aspiration is present but reduced when following general aspiration precautions. He requires min-mod tactile/verbalcues for orientation to bolus presentation, and during feeding support(he often maintained closed eyes and mumbled speech during the eval). Pt consumed trials of ice chips, purees, and TSP/Cup sips of Nectar liquids w/ no overt clinical s/s of aspiration noted; no decline in vocal quality during verbalizations, no cough, and no decline in respiratory status during/post trials(O2 sats 92-95% as at Baseline). Laryngeal excursion was adequate. Oral phase was grossly [facility] for bolus management, timely A-P transfer, and oral clearing of the boluses given. When Thin liquidswere assessed, pt appeared to present w/ less bolus control/coordination during bolus management and swallowing - though, not overt s/s of aspiration were noted. OM Exam was cursory, but No unilateral weakness noted during oral care given. Speech grossly clear.  D/t pt's declined mental status currently, recent/lengthy oral intubation, and his risk for aspiration, recommend initiation of the dysphagia level 1(puree) w/ Nectar liquids; general aspiration precautions; reduce Distractions during meals. Pills Crushed in Puree for safer swallowing. Support w/ feeding at meals -- check for oral clearing during/post intake. Recommend REFLUX precautions d/t increased expectorated phlegm(frothy appearance). NSG updated. ST services will continue to follow for toleration of diet upgrade as appropriate. SLP Visit Diagnosis: Dysphagia, oropharyngeal phase (R13.12) (AMS)    Aspiration Risk  Mild aspiration risk;Risk for inadequate nutrition/hydration    Diet Recommendation  Dysphagia level 1 (puree) w/ NECTAR consistency liquids; aspiration and reflux precautions. Feeding support and monitoring at meals d/t AMS  Medication Administration: Crushed with puree (for safer swallowing)     Other  Recommendations Recommended Consults:  (Dietician f/u) Oral Care Recommendations: Oral care BID;Oral care before and after PO;Staff/trained caregiver to provide oral care Other Recommendations: Order thickener from pharmacy;Prohibited food (jello, ice cream, thin soups);Remove water pitcher;Have oral suction available   Follow up Recommendations  (TBD)      Frequency and Duration min 3x week  2 weeks       Prognosis Prognosis for Safe Diet Advancement: Fair Barriers to Reach Goals: Cognitive deficits;Time post onset;Severity of deficits;Behavior Barriers/Prognosis Comment: AMS      Swallow Study   General Date of Onset: 04/06/20 HPI: Pt is a 69 y.o. male with medical history significant of HTN, Tobacco abuse, not dx'd w/ COPD per chart presented with altered MS , speech changes, and stumbling gait per wife. Per wife for the past couple of days patient has been acting differently but unable to exactly tell me how.  She does report when he was trying to talk while watching TV with her, but was coming out of his mouth was different than what he meant.  No slurring of speech.  She felt this was worse today.  Also noticed him with a stumbling type gait last couple of days.  Family called EMS and patient was found to be satting in 78% on room air and was placed on 4 L of oxygen.  He does report having worsening leg swelling that started a few days ago as well.  Denies ever having this previously.  Patient did have a chest x-ray showing pulmonary vascular congestion consistent with congestive heart failure.  Pt was placed on BiPAP then required oral intubation d/t respiratory decline secondary to severe respiratory acidosis requiring mechanical intubation and ICU transfer.; he was extubated on 04/11/2020.  Remains confused but alert and conversing with others.  No acute finding by MRI. Chiari malformation noted.  Type of Study: Bedside Swallow Evaluation Previous Swallow Assessment: none Diet  Prior to this Study: NPO Temperature Spikes Noted: No (wbc 13.4) Respiratory Status: Nasal cannula (2L) History of Recent Intubation: Yes Length of Intubations (days): 5 days Date extubated: 04/11/20 Behavior/Cognition: Alert;Cooperative;Pleasant mood;Confused;Distractible;Requires cueing Oral Cavity Assessment: Dry Oral Care Completed by SLP: Yes Oral Cavity - Dentition:  (only has few lower front dentition; upper plate in cup) Vision:  (CNT) Self-Feeding Abilities: Total assist Patient Positioning: Upright in bed (needed positioning) Baseline Vocal Quality: Normal (adequate) Volitional Cough: Strong (expectorated phlegm) Volitional Swallow: Able to elicit    Oral/Motor/Sensory Function Overall Oral Motor/Sensory Function: Within functional limits (grossly)   Ice Chips Ice chips: Within functional limits Presentation: Spoon (fed; 4 trials)   Thin Liquid Thin Liquid: Impaired Presentation: Spoon (6 trials) Oral Phase Impairments: Poor awareness of bolus Oral Phase Functional Implications:  (less orgranized) Pharyngeal  Phase Impairments:  (less coordinated appearing)    Nectar Thick Nectar Thick Liquid: Within functional limits Presentation: Cup;Self Fed;Spoon (~4 ozs total) Other Comments: better managed and coordinated   Honey Thick Honey Thick Liquid: Not tested   Puree Puree: Within functional limits Presentation: Spoon (fed; ~3 ozs)   Solid     Solid: Not tested       [name], MS, CCC-SLP Dakota Dunn 04/12/2020,1:56 PM

## 2020-04-13 LAB — CBC WITH DIFFERENTIAL/PLATELET
Abs Immature Granulocytes: 0.06 10*3/uL (ref 0.00–0.07)
Basophils Absolute: 0 10*3/uL (ref 0.0–0.1)
Basophils Relative: 0 %
Eosinophils Absolute: 0 10*3/uL (ref 0.0–0.5)
Eosinophils Relative: 0 %
HCT: 56 % — ABNORMAL HIGH (ref 39.0–52.0)
Hemoglobin: 18.2 g/dL — ABNORMAL HIGH (ref 13.0–17.0)
Immature Granulocytes: 1 %
Lymphocytes Relative: 9 %
Lymphs Abs: 1 10*3/uL (ref 0.7–4.0)
MCH: 29.6 pg (ref 26.0–34.0)
MCHC: 32.5 g/dL (ref 30.0–36.0)
MCV: 91.1 fL (ref 80.0–100.0)
Monocytes Absolute: 0.9 10*3/uL (ref 0.1–1.0)
Monocytes Relative: 8 %
Neutro Abs: 9.1 10*3/uL — ABNORMAL HIGH (ref 1.7–7.7)
Neutrophils Relative %: 82 %
Platelets: 274 10*3/uL (ref 150–400)
RBC: 6.15 MIL/uL — ABNORMAL HIGH (ref 4.22–5.81)
RDW: 12.9 % (ref 11.5–15.5)
WBC: 11.1 10*3/uL — ABNORMAL HIGH (ref 4.0–10.5)
nRBC: 0 % (ref 0.0–0.2)

## 2020-04-13 LAB — BASIC METABOLIC PANEL
Anion gap: 9 (ref 5–15)
BUN: 38 mg/dL — ABNORMAL HIGH (ref 8–23)
CO2: 30 mmol/L (ref 22–32)
Calcium: 9.5 mg/dL (ref 8.9–10.3)
Chloride: 96 mmol/L — ABNORMAL LOW (ref 98–111)
Creatinine, Ser: 0.87 mg/dL (ref 0.61–1.24)
GFR calc Af Amer: >60 mL/min (ref >60)
GFR calc non Af Amer: >60 mL/min (ref >60)
Glucose, Bld: 115 mg/dL — ABNORMAL HIGH (ref 70–99)
Potassium: 4.9 mmol/L (ref 3.5–5.1)
Sodium: 135 mmol/L (ref 135–145)

## 2020-04-13 LAB — MAGNESIUM: Magnesium: 1.9 mg/dL (ref 1.7–2.4)

## 2020-04-13 LAB — PHOSPHORUS: Phosphorus: 4.3 mg/dL (ref 2.5–4.6)

## 2020-04-13 MED ORDER — IPRATROPIUM-ALBUTEROL 0.5-2.5 (3) MG/3ML IN SOLN
3.0000 mL | Freq: Three times a day (TID) | RESPIRATORY_TRACT | Status: DC
  Administered 2020-04-14 – 2020-04-15 (×4): 3 mL via RESPIRATORY_TRACT
  Filled 2020-04-13 (×6): qty 3

## 2020-04-13 NOTE — Progress Notes (Signed)
PROGRESS NOTE    Dakota Dunn  KXF:[id number] DOB: 02-07-1951 DOA: 04/06/2020 PCP: Patient, No Pcp Per   Brief Narrative:  69 yo male admitted with acute vs. subacute cortical infarct, elevated troponin's, and acute hypercapnic respiratory failure secondary to vascular congestion and suspected undiagnosed COPD exacerbation requiring mechanical intubation 07/11: Pt presented to [facility] ER with AMS and acute respiratory failure  07/11: CT Head revealed small focal cortical hypodensity right frontal lobe which may reflect acute or subacute cortical infarct. No evidence of hemorrhage.Neurology consulted recommended MR Brain 07/11: US Venous BLE negative for DVT 07/11: Pt initially admitted to the stepdown unit, however while awaiting for stepdown bed availability he decompensated secondary to respiratory acidosis requiring mechanical intubation and transfer to ICU 7/12 multiorgan failure DNR/DNI 7/13 MRI reports are NEG for acute CVA 7/14 plan for SAT/SBT today 7/14 failed SAT/SBT , family at bedisde witnessed 7/15 failed SAT/SBT, family at bedside witnessed 7/16: Extubated, care transferred to hospitalist service 7/17: Patient seen and examined in ICU. Respiratory status markedly improved. Mental status also improved. 7/18: Examined.  MedSurg status.  Respiratory status improved.  Mental status improved.  Assessment & Plan:   Active Problems:   Stroke [facility])  Severe acute hypoxic and hypercapnic respiratory failure Successfully extubated on 04/11/2020 Respiratory status stable for now but tenuous considering severity of respiratory failure Plan: Wean O2 as tolerated Continue bronchodilators Continue IV Solu-Medrol 20 mg every 12 hours today.  If respiratory status remains stable can transition to p.o. 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  Status post extubation day #2.  Respiratory status improved.  Transition to MedSurg status.  Physical therapy and Occupational Therapy consults requested.  Consultants:   Cards- signed off  Procedures:   Endotracheal intubation, now extubated 04/11/2020  Antimicrobials:   None   Subjective: Seen and examined.  Appears deconditioned and weak but overall stable.  Objective: Vitals:   04/13/20 0800 04/13/20 0900 04/13/20 1000 04/13/20 1100  BP: 133/90     Pulse: 76 79 98 64  Resp: (!) 23 (!) 23 (!) 25 (!) 23  Temp: 98.2 F (36.8 C)     TempSrc: Oral     SpO2: 93% 96% 90% (!) 89%  Weight:      Height:        Intake/Output Summary (Last 24 hours) at 04/13/2020 1122 Last data filed at 04/13/2020 0759 Gross per 24 hour  Intake 593.14 ml  Output 1350 ml  Net -756.86 ml   Filed Weights   04/10/20 0500 04/11/20 0247 04/12/20 0334  Weight: 91.1 kg 87.2 kg 80.2 kg    Examination:  General exam: Appears weak and deconditioned Respiratory system: Normal work of breathing.  Scattered crackles Cardiovascular system: S1 & S2 heard, RRR.  No JVD, murmurs, rubs, gallops or clicks. No pedal edema. Gastrointestinal system: Abdomen is nondistended, soft and nontender. No organomegaly or masses felt. Normal bowel sounds heard. Central nervous system: Alert and oriented. No focal neurological deficits. Extremities: Symmetric 5 x 5 power. Skin: No rashes, lesions or  ulcers Psychiatry: Judgement and insight appear normal. Mood & affect appropriate.     Data Reviewed: I have personally reviewed following labs and imaging studies  CBC: Recent Labs  Lab 04/09/20 0424 04/10/20 0400 04/11/20 0400 04/12/20 0507 04/13/20 0257  WBC 10.2 10.4 10.6* 13.4* 11.1*  NEUTROABS 9.0* 9.1* 9.4* 10.8* 9.1*  HGB 14.7 14.9 15.2 17.7* 18.2*  HCT 44.7 47.8 49.1 55.8* 56.0*  MCV 92.5 96.2 97.0 93.8 91.1  PLT 236 228 241 264 595   Basic Metabolic Panel: Recent Labs  Lab 04/09/20 0424 04/09/20 0424 04/10/20 0400 04/10/20 0400 04/11/20 0400 04/11/20 0400 04/11/20 0904 04/11/20 1351 04/11/20 2122 04/12/20 0507 04/13/20 0257  NA 144   < > 145  --  143  --   --   --  136 135 135  K 4.5   < > 5.2*   < > 6.7*   < > 6.6* 6.2* 5.6* 5.4* 4.9  CL 104   < > 104  --  102  --   --   --  92* 91* 96*  CO2 33*   < > 35*  --  34*  --   --   --  31 29 30   GLUCOSE 135*   < > 134*  --  144*  --   --   --  94 94 115*  BUN 37*   < > 38*  --  41*  --   --   --  31* 34* 38*  CREATININE 0.87   < > 0.77  --  0.93  --   --   --  0.79 0.88 0.87  CALCIUM 8.5*   < > 8.8*  --  8.7*  --   --   --  10.0 10.2 9.5  MG 2.3  --  2.2  --  2.2  --   --   --   --  1.6* 1.9  PHOS 2.5  --  3.5  --  5.1*  --   --   --   --  2.7 4.3   < > = values in this interval not displayed.   GFR: Estimated Creatinine Clearance: 80.1 mL/min (by C-G formula based on SCr of 0.87 mg/dL). Liver Function Tests: Recent Labs  Lab 04/06/20 1409 04/07/20 0419 04/08/20 0451 04/09/20 0424  AST 109* 69* 47* 34  ALT 132* 98* 84* 70*  ALKPHOS 80 64 56 51  BILITOT 0.5 0.6 0.5 0.6  PROT 7.4 6.0* 5.8* 5.5*  ALBUMIN 3.7 3.0* 2.8* 2.8*   No results for input(s): LIPASE, AMYLASE in the last 168 hours. No results for input(s): AMMONIA in the last 168 hours. Coagulation Profile: No results for input(s): INR, PROTIME in the last 168 hours. Cardiac Enzymes: No results for input(s): CKTOTAL, CKMB, CKMBINDEX,  TROPONINI in the last 168 hours. BNP (last 3 results) No results for input(s): PROBNP in the last 8760 hours. HbA1C: No results for input(s): HGBA1C in the last 72 hours. CBG: Recent Labs  Lab 04/12/20 0320 04/12/20 0731 04/12/20 1108 04/12/20 1533 04/12/20 1914  GLUCAP 92 103* 102* 129* 121*   Lipid Profile: Recent Labs    04/10/20 1808  TRIG 60   Thyroid Function  Tests: No results for input(s): TSH, T4TOTAL, FREET4, T3FREE, THYROIDAB in the last 72 hours. Anemia Panel: No results for input(s): VITAMINB12, FOLATE, FERRITIN, TIBC, IRON, RETICCTPCT in the last 72 hours. Sepsis Labs: Recent Labs  Lab 04/06/20 1409 04/07/20 0419 04/08/20 0451  PROCALCITON 0.62 0.29 0.10  LATICACIDVEN 1.6  --   --     Recent Results (from the past 240 hour(s))  SARS Coronavirus 2 by RT PCR (hospital order, performed in [facility] hospital lab) Nasopharyngeal Nasopharyngeal Swab     Status: None   Collection Time: 04/06/20  2:09 PM   Specimen: Nasopharyngeal Swab  Result Value Ref Range Status   SARS Coronavirus 2 NEGATIVE NEGATIVE Final    Comment: (NOTE) SARS-CoV-2 target nucleic acids are NOT DETECTED.  The SARS-CoV-2 RNA is generally detectable in upper and lower respiratory specimens during the acute phase of infection. The lowest concentration of SARS-CoV-2 viral copies this assay can detect is 250 copies / mL. A negative result does not preclude SARS-CoV-2 infection and should not be used as the sole basis for treatment or other patient management decisions.  A negative result may occur with improper specimen collection / handling, submission of specimen other than nasopharyngeal swab, presence of viral mutation(s) within the areas targeted by this assay, and inadequate number of viral copies (<250 copies / mL). A negative result must be combined with clinical observations, patient history, and epidemiological information.  Fact Sheet for Patients:    StrictlyIdeas.no  Fact Sheet for Healthcare Providers: BankingDealers.co.za  This test is not yet approved or  cleared by the [place] FDA and has been authorized for detection and/or diagnosis of SARS-CoV-2 by FDA under an Emergency Use Authorization (EUA).  This EUA will remain in effect (meaning this test can be used) for the duration of the COVID-19 declaration under Section 564(b)(1) of the Act, 21 U.S.C. section 360bbb-3(b)(1), unless the authorization is terminated or revoked sooner.  Performed at [facility], [address] [place], [place] [address]   Blood culture (routine x 2)     Status: None   Collection Time: 04/06/20  3:32 PM   Specimen: BLOOD  Result Value Ref Range Status   Specimen Description BLOOD RIGHT ANTECUBITAL  Final   Special Requests   Final    BOTTLES DRAWN AEROBIC AND ANAEROBIC Blood Culture adequate volume   Culture   Final    NO GROWTH 5 DAYS Performed at [facility], [address], [place], [place] [address]    Report Status 04/11/2020 FINAL  Final  Blood culture (routine x 2)     Status: None   Collection Time: 04/06/20  3:37 PM   Specimen: BLOOD  Result Value Ref Range Status   Specimen Description BLOOD LEFT ANTECUBITAL  Final   Special Requests   Final    BOTTLES DRAWN AEROBIC AND ANAEROBIC Blood Culture adequate volume   Culture   Final    NO GROWTH 5 DAYS Performed at [facility], [address] [place], [place] [address]    Report Status 04/11/2020 FINAL  Final  MRSA PCR Screening     Status: None   Collection Time: 04/07/20 12:43 AM   Specimen: Nasal Mucosa; Nasopharyngeal  Result Value Ref Range Status   MRSA by PCR NEGATIVE NEGATIVE Final    Comment:        The GeneXpert MRSA Assay (FDA approved for NASAL specimens only), is one component of a comprehensive MRSA colonization surveillance program. It is not  intended to diagnose  MRSA infection nor to guide or monitor treatment for MRSA infections. Performed at [facility], [address] [place], [place] [address]          Radiology Studies: No results found.      Scheduled Meds: .  stroke: mapping our early stages of recovery book   Does not apply Once  . atorvastatin  80 mg Per Tube Daily  . budesonide (PULMICORT) nebulizer solution  0.5 mg Nebulization BID  . Chlorhexidine Gluconate Cloth  6 each Topical Daily  . docusate  100 mg Per Tube BID  . enoxaparin (LOVENOX) injection  40 mg Subcutaneous Q24H  . feeding supplement (PRO-STAT 64)  30 mL Per Tube Daily  . furosemide  40 mg Intravenous Once  . ipratropium-albuterol  3 mL Nebulization Q4H  . mouth rinse  15 mL Mouth Rinse BID  . methylPREDNISolone (SOLU-MEDROL) injection  20 mg Intravenous Q12H  . polyethylene glycol  17 g Per Tube Daily   Continuous Infusions: . famotidine (PEPCID) IV 20 mg (04/13/20 0849)  . feeding supplement (VITAL AF 1.2 CAL) Stopped (04/11/20 0700)     LOS: 7 days    Time spent: 25 minutes    Sidney Ace, MD [facility] Pager [phone]  If 7PM-7AM, please contact night-coverage 04/13/2020, 11:22 AM

## 2020-04-13 NOTE — Progress Notes (Signed)
Pt transferred to RM #[id number] at this time. VSS prior to transfer. Report given to receiving RN. Family aware.

## 2020-04-13 NOTE — Evaluation (Signed)
Physical Therapy Evaluation Patient Details Name: Dakota Dunn MRN: [id number] DOB: 11/11/1950 Today's Date: 04/13/2020   History of Present Illness  Pt is a 69 yo male admitted with acute vs. subacute cortical infarct, elevated troponin's, and acute hypercapnic respiratory failure secondary to vascular congestion and suspected undiagnosed COPD exacerbation requiring mechanical intubation.  Per MRI pt negative for acute CVA.  MD assessement includes: Severe acute hypoxic and hypercapnic respiratory failure with pt intubated 7/11 and extubated 7/16, Acute exacerbation of diastolic congestive heart failure, acute NSTEMI, and acute renal failure.    Clinical Impression  Pt pleasant and motivated to participate during the session.  Pt required no assistance with bed mobility tasks or standing up from an elevated EOB but required extra time and effort along with cues for sequencing to do so.  Once in standing pt required Mod A to prevent posterior LOB both during static standing and during limited ambulation.  Pt's SpO2 dropped to a low of 87% during the session on 2LO2/min which occurred after ambulation with SpO2 otherwise ranging from 90-93%, nursing notified.  Pt presented with HR in the 70's or 80's at rest increasing to low 100s with exertion.  Pt presented with significant deficits in functional mobility compared to his baseline and is at a very high risk for falls.  Pt will benefit from PT services in a SNF setting upon discharge to safely address deficits listed in patient problem list for decreased caregiver assistance and eventual return to PLOF.       Follow Up Recommendations SNF    Equipment Recommendations  Rolling walker with 5" wheels;3in1 (PT) (TBD at next venue of care)    Recommendations for Other Services       Precautions / Restrictions Precautions Precautions: Fall Precaution Comments: seizure precautions Restrictions Weight Bearing Restrictions: No Other  Position/Activity Restrictions: Keep O2 >/= 94%      Mobility  Bed Mobility Overal bed mobility: Modified Independent             General bed mobility comments: Extra time and effort and use of the bed rails  Transfers Overall transfer level: Needs assistance Equipment used: Rolling walker (2 wheeled) Transfers: Sit to/from Stand Sit to Stand: Mod assist         General transfer comment: Mod verbal and tactile cues for sequencing and to prevent posterior LOB upon standing  Ambulation/Gait Ambulation/Gait assistance: Mod assist Gait Distance (Feet): 5 Feet Assistive device: Rolling walker (2 wheeled) Gait Pattern/deviations: Step-through pattern;Decreased step length - left;Decreased step length - right Gait velocity: decreased   General Gait Details: Mod A to prevent posterior LOB during amb  Stairs            Wheelchair Mobility    Modified Rankin (Stroke Patients Only)       Balance Overall balance assessment: Needs assistance   Sitting balance-Leahy Scale: Good     Standing balance support: Bilateral upper extremity supported;During functional activity Standing balance-Leahy Scale: Poor Standing balance comment: Posterior instability in standing                             Pertinent Vitals/Pain Pain Assessment: No/denies pain    Home Living Family/patient expects to be discharged to:: Private residence Living Arrangements: Spouse/significant other Available Help at Discharge: Family;Available 24 hours/day Type of Home: House Home Access: Stairs to enter Entrance Stairs-Rails: None Entrance Stairs-Number of Steps: 1 Home Layout: One level Home Equipment: None  Prior Function Level of Independence: Independent         Comments: Ind amb community distances without an AD, no fall history, Ind with ADLs     Hand Dominance        Extremity/Trunk Assessment   Upper Extremity Assessment Upper Extremity Assessment:  Generalized weakness    Lower Extremity Assessment Lower Extremity Assessment: Generalized weakness       Communication   Communication: No difficulties  Cognition Arousal/Alertness: Awake/alert Behavior During Therapy: WFL for tasks assessed/performed Overall Cognitive Status: Within Functional Limits for tasks assessed                                 General Comments: Per family pt is "mostly back to himself" cognitively      General Comments      Exercises Total Joint Exercises Ankle Circles/Pumps: AROM;Strengthening;Both;10 reps Quad Sets: Strengthening;Both;10 reps Gluteal Sets: Strengthening;Both;10 reps Hip ABduction/ADduction: Strengthening;Both;5 reps Straight Leg Raises: Strengthening;Both;5 reps Long Arc Quad: Strengthening;AROM;Both;10 reps Knee Flexion: Strengthening;Both;10 reps Marching in Standing: AROM;Both;5 reps;Standing Other Exercises Other Exercises: HEP education with pt and spouse for BLE APs, QS, GS, and LAQs x 10 each every 1-2 hours daily   Assessment/Plan    PT Assessment Patient needs continued PT services  PT Problem List Decreased strength;Decreased activity tolerance;Decreased balance;Decreased mobility;Decreased knowledge of use of DME       PT Treatment Interventions DME instruction;Gait training;Stair training;Functional mobility training;Therapeutic activities;Therapeutic exercise;Balance training;Patient/family education    PT Goals (Current goals can be found in the Care Plan section)  Acute Rehab PT Goals Patient Stated Goal: To be able to walk to the bathroom PT Goal Formulation: With patient Time For Goal Achievement: 04/26/20 Potential to Achieve Goals: Good    Frequency Min 2X/week   Barriers to discharge Inaccessible home environment      Co-evaluation               AM-PAC PT "6 Clicks" Mobility  Outcome Measure Help needed turning from your back to your side while in a flat bed without using  bedrails?: A Little Help needed moving from lying on your back to sitting on the side of a flat bed without using bedrails?: A Little Help needed moving to and from a bed to a chair (including a wheelchair)?: A Lot Help needed standing up from a chair using your arms (e.g., wheelchair or bedside chair)?: A Lot Help needed to walk in hospital room?: A Lot Help needed climbing 3-5 steps with a railing? : Total 6 Click Score: 13    End of Session Equipment Utilized During Treatment: Gait belt;Oxygen Activity Tolerance: Patient tolerated treatment well Patient left: in bed;with call bell/phone within reach;with bed alarm set;with family/visitor present;with SCD's reapplied Nurse Communication: Mobility status;Other (comment) (SpO2 with activity to low of 87% with amb) PT Visit Diagnosis: Unsteadiness on feet (R26.81);Muscle weakness (generalized) (M62.81);Difficulty in walking, not elsewhere classified (R26.2)    Time: 0177-9390 PT Time Calculation (min) (ACUTE ONLY): 35 min   Charges:   PT Evaluation $PT Eval Moderate Complexity: 1 Mod PT Treatments $Therapeutic Exercise: 8-22 mins        D. Royetta Asal PT, DPT 04/13/20, 5:06 PM

## 2020-04-13 NOTE — Plan of Care (Signed)
Pt transferred from ICU today.  Answers appropriately. Disoriented to time.  Denies pain. VSS. O2 sats 94% on 2L per [place].

## 2020-04-14 LAB — CBC WITH DIFFERENTIAL/PLATELET
Abs Immature Granulocytes: 0.06 10*3/uL (ref 0.00–0.07)
Basophils Absolute: 0 10*3/uL (ref 0.0–0.1)
Basophils Relative: 0 %
Eosinophils Absolute: 0 10*3/uL (ref 0.0–0.5)
Eosinophils Relative: 0 %
HCT: 55 % — ABNORMAL HIGH (ref 39.0–52.0)
Hemoglobin: 18.6 g/dL — ABNORMAL HIGH (ref 13.0–17.0)
Immature Granulocytes: 1 %
Lymphocytes Relative: 12 %
Lymphs Abs: 1.1 10*3/uL (ref 0.7–4.0)
MCH: 30.1 pg (ref 26.0–34.0)
MCHC: 33.8 g/dL (ref 30.0–36.0)
MCV: 89 fL (ref 80.0–100.0)
Monocytes Absolute: 0.5 10*3/uL (ref 0.1–1.0)
Monocytes Relative: 5 %
Neutro Abs: 7.6 10*3/uL (ref 1.7–7.7)
Neutrophils Relative %: 82 %
Platelets: 280 10*3/uL (ref 150–400)
RBC: 6.18 MIL/uL — ABNORMAL HIGH (ref 4.22–5.81)
RDW: 13 % (ref 11.5–15.5)
WBC: 9.3 10*3/uL (ref 4.0–10.5)
nRBC: 0 % (ref 0.0–0.2)

## 2020-04-14 LAB — BASIC METABOLIC PANEL
Anion gap: 8 (ref 5–15)
BUN: 47 mg/dL — ABNORMAL HIGH (ref 8–23)
CO2: 30 mmol/L (ref 22–32)
Calcium: 9.2 mg/dL (ref 8.9–10.3)
Chloride: 100 mmol/L (ref 98–111)
Creatinine, Ser: 0.89 mg/dL (ref 0.61–1.24)
GFR calc Af Amer: >60 mL/min (ref >60)
GFR calc non Af Amer: >60 mL/min (ref >60)
Glucose, Bld: 148 mg/dL — ABNORMAL HIGH (ref 70–99)
Potassium: 4.3 mmol/L (ref 3.5–5.1)
Sodium: 138 mmol/L (ref 135–145)

## 2020-04-14 LAB — MAGNESIUM: Magnesium: 1.9 mg/dL (ref 1.7–2.4)

## 2020-04-14 LAB — PHOSPHORUS: Phosphorus: 3.6 mg/dL (ref 2.5–4.6)

## 2020-04-14 MED ORDER — ENSURE ENLIVE PO LIQD
237.0000 mL | Freq: Two times a day (BID) | ORAL | Status: DC
  Administered 2020-04-15: 237 mL via ORAL

## 2020-04-14 MED ORDER — ADULT MULTIVITAMIN W/MINERALS CH
1.0000 | ORAL_TABLET | Freq: Every day | ORAL | Status: DC
  Administered 2020-04-14 – 2020-04-15 (×2): 1 via ORAL
  Filled 2020-04-14 (×2): qty 1

## 2020-04-14 MED ORDER — PREDNISONE 20 MG PO TABS
40.0000 mg | ORAL_TABLET | Freq: Every day | ORAL | Status: DC
  Administered 2020-04-14 – 2020-04-15 (×2): 40 mg via ORAL
  Filled 2020-04-14 (×2): qty 2

## 2020-04-14 NOTE — NC FL2 (Signed)
[place] LEVEL OF CARE SCREENING TOOL     IDENTIFICATION  Patient Name: Dakota Dunn Birthdate: 1950-11-09 Sex: male Admission Date (Current Location): 04/06/2020  [place] and [place] Number:  Engineering geologist and Address:  [facility], [address] [place], [place] [address]      Provider Number: [id number]  Attending Physician Name and Address:  Sidney Ace, MD  Relative Name and Phone Number:  Rozell Theiler (wife) [phone]    Current Level of Care: Hospital Recommended Level of Care: [place] Prior Approval Number:    Date Approved/Denied:   PASRR Number: [id number]  Discharge Plan: SNF    Current Diagnoses: Patient Active Problem List   Diagnosis Date Noted  . Stroke ([place]) 04/06/2020    Orientation RESPIRATION BLADDER Height & Weight     Self, Time, Situation, Place  O2 ([place] 2L) Continent Weight: 80.3 kg Height:  5' 9.02" (175.3 cm)  BEHAVIORAL SYMPTOMS/MOOD NEUROLOGICAL BOWEL NUTRITION STATUS      Continent Diet (see discharge summary)  AMBULATORY STATUS COMMUNICATION OF NEEDS Skin   Limited Assist Verbally Normal                       Personal Care Assistance Level of Assistance  Bathing, Feeding, Dressing Bathing Assistance: Limited assistance Feeding assistance: Independent Dressing Assistance: Limited assistance     Functional Limitations Info             SPECIAL CARE FACTORS FREQUENCY  PT (By licensed PT), OT (By licensed OT)     PT Frequency: 5 times per week OT Frequency: 5 times per week            Contractures Contractures Info: Not present    Additional Factors Info  Code Status, Allergies Code Status Info: DNR Allergies Info: NKA           Current Medications (04/14/2020):  This is the current hospital active medication list Current Facility-Administered Medications  Medication Dose Route Frequency Provider Last Rate Last Admin  .   stroke: mapping our early stages of recovery book   Does not apply Once Nolberto Hanlon, MD      . acetaminophen (TYLENOL) tablet 650 mg  650 mg Oral Q4H PRN Nolberto Hanlon, MD       Or  . acetaminophen (TYLENOL) 160 MG/5ML solution 650 mg  650 mg Per Tube Q4H PRN Nolberto Hanlon, MD       Or  . acetaminophen (TYLENOL) suppository 650 mg  650 mg Rectal Q4H PRN Nolberto Hanlon, MD      . atorvastatin (LIPITOR) tablet 80 mg  80 mg Per Tube Daily Awilda Bill, NP   80 mg at 04/14/20 0955  . budesonide (PULMICORT) nebulizer solution 0.5 mg  0.5 mg Nebulization BID Awilda Bill, NP   0.5 mg at 04/14/20 0737  . Chlorhexidine Gluconate Cloth 2 % PADS 6 each  6 each Topical Daily Nolberto Hanlon, MD   6 each at 04/12/20 2103  . docusate (COLACE) 50 MG/5ML liquid 100 mg  100 mg Per Tube BID Awilda Bill, NP   100 mg at 04/13/20 0844  . enoxaparin (LOVENOX) injection 40 mg  40 mg Subcutaneous Q24H Flora Lipps, MD   40 mg at 04/14/20 0957  . famotidine (PEPCID) IVPB 20 mg premix  20 mg Intravenous Q12H Awilda Bill, NP 100 mL/hr at 04/14/20 0942 20 mg at 04/14/20 0942  . feeding supplement (PRO-STAT  64) liquid 30 mL  30 mL Per Tube Daily Flora Lipps, MD   30 mL at 04/14/20 0952  . feeding supplement (VITAL AF 1.2 CAL) liquid 1,000 mL  1,000 mL Per Tube Continuous Flora Lipps, MD   Stopped at 04/11/20 0700  . ipratropium-albuterol (DUONEB) 0.5-2.5 (3) MG/3ML nebulizer solution 3 mL  3 mL Nebulization Q6H PRN Awilda Bill, NP      . ipratropium-albuterol (DUONEB) 0.5-2.5 (3) MG/3ML nebulizer solution 3 mL  3 mL Nebulization TID Awilda Bill, NP   3 mL at 04/14/20 1340  . labetalol (NORMODYNE) injection 10 mg  10 mg Intravenous Q2H PRN Nolberto Hanlon, MD      . MEDLINE mouth rinse  15 mL Mouth Rinse BID Tukov-Yual, Magdalene S, NP   15 mL at 04/14/20 0951  . polyethylene glycol (MIRALAX / GLYCOLAX) packet 17 g  17 g Per Tube Daily Awilda Bill, NP   17 g at 04/13/20 0844  . predniSONE (DELTASONE)  tablet 40 mg  40 mg Oral Q breakfast Ralene Muskrat B, MD   40 mg at 04/14/20 1006     Discharge Medications: Please see discharge summary for a list of discharge medications.  Relevant Imaging Results:  Relevant Lab Results:   Additional Information SS# [id number]  Shelbie Hutching, RN

## 2020-04-14 NOTE — Progress Notes (Signed)
Speech Language Pathology Treatment: Dysphagia  Patient Details Name: Dakota Dunn MRN: [id number] DOB: 02/05/1951 Today's Date: 04/14/2020 Time: 3267-1245 SLP Time Calculation (min) (ACUTE ONLY): 40 min  Assessment / Plan / Recommendation Clinical Impression  Pt seen for ongoing assessment of swallowing and trials to upgrade diet hopefully today. He appears much improved and alert today; verbally responsive and able to follow instructions w/ min cues. Family present; good engagement w/ them as well. Pt is on [place] 2L support post extubation; wbc wnl. He has been tolerating the dysphagia diet per NSG report. Wife helped to place Dentures prior to po's. Pt/family explained general aspiration precautions and agreed verbally to the need for following them especially to sit upright for all oral intake. Pt assisted w/ positioning w/ just min directions then given trials of thin liquids, purees and soft solids foods. NO overt clinical s/s of aspiration were noted w/ any consistency; respiratory status remained calm and unlabored, vocal quality clear b/t trials. Pt held Cup when drinking following instructions for single, small sips slowly. NO straws were utiilized for better oral control. Pt fed self food trials. Oral phase appeared grossly [facility] for bolus management and timely A-P transfer for swallowing; oral clearing achieved w/ all consistencies.  Recommend upgrade to Regular diet w/ cut meats recommended/gravies added to moisten foods; Thin liquids VIA CUP. Recommend general aspiration precautions; Pills Whole in Puree as needed for ease of swallowing; tray setup and positioning assistance for meals as needed. Education provided. NSG updated. Precautions posted at bedside. NSG to reconsult if any new needs arise while admitted. Pt/family agreed.    HPI HPI: Pt is a 69 y.o. male with medical history significant of HTN, Tobacco abuse, not dx'd w/ COPD per chart presented with altered MS , speech changes,  and stumbling gait per wife. Per wife for the past couple of days patient has been acting differently but unable to exactly tell me how.  She does report when he was trying to talk while watching TV with her, but was coming out of his mouth was different than what he meant.  No slurring of speech.  She felt this was worse today.  Also noticed him with a stumbling type gait last couple of days.  Family called EMS and patient was found to be satting in 78% on room air and was placed on 4 L of oxygen.  He does report having worsening leg swelling that started a few days ago as well.  Denies ever having this previously.  Patient did have a chest x-ray showing pulmonary vascular congestion consistent with congestive heart failure.  Pt was placed on BiPAP then required oral intubation d/t respiratory decline secondary to severe respiratory acidosis requiring mechanical intubation and ICU transfer.; he was extubated on 04/11/2020.  Remains confused but alert and conversing with others.  No acute finding by MRI. Chiari malformation noted.       SLP Plan  All goals met       Recommendations  Diet recommendations: Regular;Thin liquid Liquids provided via: Cup;No straw Medication Administration: Whole meds with puree (as needed for ease, safety of swallowing) Supervision: Patient able to self feed (setup support; monitoring as needed) Compensations: Minimize environmental distractions;Slow rate;Small sips/bites;Follow solids with liquid Postural Changes and/or Swallow Maneuvers: Seated upright 90 degrees;Upright 30-60 min after meal                General recommendations:  (PT/OT) Oral Care Recommendations: Oral care BID;Oral care before and after PO;Patient independent with  oral care (setup support) Follow up Recommendations: None SLP Visit Diagnosis: Dysphagia, unspecified (R13.10) Plan: All goals met       GO                Dakota Kenner, MS, CCC-SLP Dakota Dunn 04/14/2020, 3:29  PM

## 2020-04-14 NOTE — TOC Initial Note (Signed)
Transition of Care [facility]) - Initial/Assessment Note    Patient Details  Name: Dakota Dunn MRN: [id number] Date of Birth: July 03, 1951  Transition of Care [facility]) CM/SW Contact:    Shelbie Hutching, RN Phone Number: 04/14/2020, 1:57 PM  Clinical Narrative:                 Patient admitted to the hospital for acute respiratory failure and stroke requiring 7 days in the ICU.  Patient is on the medical floor doing much better at this time.  Family is at the bedside.  Patient lives in [place] with his wife and is independent at home.  Patient drives, he is current with his PCP, Dr. Hall Busing, and gets his prescriptions from [facility].   Patient would like to go home at discharge but agrees to go to SNF for a short period of time.  Family and patient choose [facility].  Bed search started.    Expected Discharge Plan: Skilled Nursing Facility Barriers to Discharge: Continued Medical Work up   Patient Goals and CMS Choice Patient states their goals for this hospitalization and ongoing recovery are:: to go home CMS Medicare.gov Compare Post Acute Care list provided to:: Patient Choice offered to / list presented to : Patient  Expected Discharge Plan and Services Expected Discharge Plan: [place]   Discharge Planning Services: CM Consult Post Acute Care Choice: [place] Living arrangements for the past 2 months: Single Family Home                                      Prior Living Arrangements/Services Living arrangements for the past 2 months: Single Family Home Lives with:: Spouse Patient language and need for interpreter reviewed:: Yes Do you feel safe going back to the place where you live?: Yes      Need for Family Participation in Patient Care: Yes (Comment) (acute respiratory failure, stroke) Care giver support system in place?: Yes (comment) (wife and son)   Criminal Activity/Legal Involvement Pertinent to Current Situation/Hospitalization: No  - Comment as needed  Activities of Daily Living Home Assistive Devices/Equipment: None ADL Screening (condition at time of admission) Patient's cognitive ability adequate to safely complete daily activities?: No Is the patient deaf or have difficulty hearing?: No Does the patient have difficulty seeing, even when wearing glasses/contacts?: No Does the patient have difficulty concentrating, remembering, or making decisions?: No Patient able to express need for assistance with ADLs?: Yes Does the patient have difficulty dressing or bathing?: Yes Independently performs ADLs?: Yes (appropriate for developmental age) Communication: Independent Dressing (OT): Independent Grooming: Independent Feeding: Independent Bathing: Independent Toileting: Independent In/Out Bed: Independent Walks in Home: Independent Does the patient have difficulty walking or climbing stairs?: No Weakness of Legs: None Weakness of Arms/Hands: None  Permission Sought/Granted Permission sought to share information with : Case Freight forwarder, Customer service manager, Family Supports Permission granted to share information with : Yes, Verbal Permission Granted  Share Information with NAME: [name]  Permission granted to share info w AGENCY: [facility]  Permission granted to share info w Relationship: wife     Emotional Assessment Appearance:: Appears stated age Attitude/Demeanor/Rapport: Engaged Affect (typically observed): Accepting Orientation: : Oriented to Self, Oriented to Place, Oriented to  Time, Oriented to Situation Alcohol / Substance Use: Not Applicable Psych Involvement: No (comment)  Admission diagnosis:  Edema [R60.9] Stroke [facility]) [I63.9] Acute respiratory failure with hypoxia ([place]) [  J96.01] Patient Active Problem List   Diagnosis Date Noted  . Stroke ([place]) 04/06/2020   PCP:  Patient, No Pcp Per Pharmacy:   [id number] [address], [place] [address] [place] [address] Phone: [phone] Fax: [phone]     Social Determinants of Health (SDOH) Interventions    Readmission Risk Interventions No flowsheet data found.

## 2020-04-14 NOTE — Care Management Important Message (Signed)
Important Message  Patient Details  Name: Dakota Dunn MRN: [id number] Date of Birth: [birth year]   Medicare Important Message Given:  Yes     Juliann Pulse A Lillyahna Hemberger 04/14/2020, 11:13 AM

## 2020-04-14 NOTE — Progress Notes (Signed)
Nutrition Follow-up  DOCUMENTATION CODES:   Not applicable  INTERVENTION:  D/c Prostat D/c Vital 1.2 Ensure Enlive po BID, each supplement provides 350 kcal and 20 grams of protein Magic cup BID with meals, each supplement provides 290 kcal and 9 grams of protein MVI with minerals daily  NUTRITION DIAGNOSIS:   Inadequate oral intake related to inability to eat (pt sedated and ventilated) as evidenced by NPO status. -Ongoing, diet advanced to regular  GOAL:   Patient will meet greater than or equal to 90% of their needs  -New  MONITOR:   Vent status, Labs, Weight trends, Skin, I & O's, TF tolerance  REASON FOR ASSESSMENT:   Consult Enteral/tube feeding initiation and management  ASSESSMENT:   69 y.o. male with h/o heavy tobacco abuse, hypertension and hyperlipidemia admitted with acute stroke, acute congestive heart failure, myocardial infarction, pulmonary edema and kidney dysfunction requiring intubation for airway protection  7/11- intubated 7/16- extubated  Per notes, mental status improving, respiratory status improved, requiring intermittent 2 L [place]. Patient is stable for discharge to SNF pending bed placement.   Patient has been tolerating dysphagia diet, eating 80%. Diet advanced to regular today s/p SLP eval. Patient consumed 30% of breakfast this morning per flowsheets. Will order Ensure and Magic Cup supplements to aid with meeting updated estimated needs s/p extubation.  Weights have trended down 8 lbs this admission.  I/Os: -6062 ml since admit   -450 ml x 24 hrs UOP: 550 ml x 24 hrs Medications reviewed and include: Colace, Miralax, Prednisone, Prostat IVPB: Pepcid Labs: CBGs 101,751,025  Diet Order:   Diet Order            Diet regular Room service appropriate? Yes with Assist; Fluid consistency: Thin  Diet effective now                 EDUCATION NEEDS:   No education needs have been identified at this time  Skin:  Skin Assessment: Reviewed  RN Assessment  Last BM:  7/18 type 6  Height:   Ht Readings from Last 1 Encounters:  04/11/20 5' 9.02" (1.753 m)    Weight:   Wt Readings from Last 1 Encounters:  04/14/20 80.3 kg    Ideal Body Weight:  72.7 kg  BMI:  Body mass index is 26.13 kg/m.  Estimated Nutritional Needs: New  Kcal:  2000-2200  Protein:  90-100  Fluid:  2 L/day   Lajuan Lines, RD, LDN Clinical Nutrition After Hours/Weekend Pager # in [place]

## 2020-04-14 NOTE — Progress Notes (Signed)
PROGRESS NOTE    Dakota Dunn  UXN:[id number] DOB: [birth year] DOA: 04/06/2020 PCP: Patient, No Pcp Per   Brief Narrative:  69 yo male admitted with acute vs. subacute cortical infarct, elevated troponin's, and acute hypercapnic respiratory failure secondary to vascular congestion and suspected undiagnosed COPD exacerbation requiring mechanical intubation 07/11: Pt presented to [facility] ER with AMS and acute respiratory failure  07/11: CT Head revealed small focal cortical hypodensity right frontal lobe which may reflect acute or subacute cortical infarct. No evidence of hemorrhage.Neurology consulted recommended MR Brain 07/11: US Venous BLE negative for DVT 07/11: Pt initially admitted to the stepdown unit, however while awaiting for stepdown bed availability he decompensated secondary to respiratory acidosis requiring mechanical intubation and transfer to ICU 7/12 multiorgan failure DNR/DNI 7/13 MRI reports are NEG for acute CVA 7/14 plan for SAT/SBT today 7/14 failed SAT/SBT , family at bedisde witnessed 7/15 failed SAT/SBT, family at bedside witnessed 7/16: Extubated, care transferred to hospitalist service 7/17: Patient seen and examined in ICU. Respiratory status markedly improved. Mental status also improved. 7/18: Examined.  MedSurg status.  Respiratory status improved.  Mental status improved. 7/19: Patient seen and examined. Mental status improving. Respiratory status at baseline. Weaned off supplemental oxygen. Physical therapy worked with patient yesterday. Recommended skilled nursing facility placement. Discussed this with patient who is in agreement.  Assessment & Plan:   Active Problems:   Stroke [facility])  Severe acute hypoxic and hypercapnic respiratory failure Successfully extubated on 04/11/2020 Respiratory status stable for now but tenuous considering severity of respiratory failure Plan: Wean O2 as tolerated Continue bronchodilators Transition to p.o. prednisone  today  Acute exacerbation of diastolic congestive heart failure Acute NSTEMI Stabilizing from cardiac standpoint Lasix if needed No further cardiac diagnostics Cardiology signed off  Acute renal failure Likely secondary to above Improving   DVT prophylaxis: Lovenox Code Status: DNR Family Communication: None today Disposition Plan: Status is: Inpatient  Remains inpatient appropriate because:Inpatient level of care appropriate due to severity of illness   Dispo: The patient is from: Home              Anticipated d/c is to: Home              Anticipated d/c date is: 2 days              Patient currently is not medically stable to d/c.  Status post extubation day #3.  Respiratory status improved. Patient still intermittently dependent on 2 L nasal cannula. Attempt incentive spirometry and continue nebulizer therapy in order to wean off entirely. Patient agreeable to skilled nursing facility placement. TOC consult placed. Stable for discharge once SNF bed found.  Consultants:   Cards- signed off  Procedures:   Endotracheal intubation, now extubated 04/11/2020  Antimicrobials:   None   Subjective: Seen and examined. Respiratory status stabilized. Energy level improving.  Objective: Vitals:   04/14/20 0314 04/14/20 0407 04/14/20 0737 04/14/20 0908  BP: 115/70   120/73  Pulse: 67   80  Resp: 17   18  Temp: 98 F (36.7 C)   (!) 97.5 F (36.4 C)  TempSrc: Oral   Oral  SpO2: (!) 85%  94% 96%  Weight:  80.3 kg    Height:        Intake/Output Summary (Last 24 hours) at 04/14/2020 1052 Last data filed at 04/14/2020 1039 Gross per 24 hour  Intake 50 ml  Output 550 ml  Net -500 ml   Filed Weights   04/11/20  9767 04/12/20 0334 04/14/20 0407  Weight: 87.2 kg 80.2 kg 80.3 kg    Examination:  General exam: Appears weak and deconditioned Respiratory system: Normal work of breathing.  Scattered crackles Cardiovascular system: S1 & S2 heard, RRR. No JVD, murmurs,  rubs, gallops or clicks. No pedal edema. Gastrointestinal system: Abdomen is nondistended, soft and nontender. No organomegaly or masses felt. Normal bowel sounds heard. Central nervous system: Alert and oriented. No focal neurological deficits. Extremities: Symmetric 5 x 5 power. Skin: No rashes, lesions or ulcers Psychiatry: Judgement and insight appear normal. Mood & affect appropriate.     Data Reviewed: I have personally reviewed following labs and imaging studies  CBC: Recent Labs  Lab 04/10/20 0400 04/11/20 0400 04/12/20 0507 04/13/20 0257 04/14/20 0425  WBC 10.4 10.6* 13.4* 11.1* 9.3  NEUTROABS 9.1* 9.4* 10.8* 9.1* 7.6  HGB 14.9 15.2 17.7* 18.2* 18.6*  HCT 47.8 49.1 55.8* 56.0* 55.0*  MCV 96.2 97.0 93.8 91.1 89.0  PLT 228 241 264 274 341   Basic Metabolic Panel: Recent Labs  Lab 04/10/20 0400 04/10/20 0400 04/11/20 0400 04/11/20 0904 04/11/20 1351 04/11/20 2122 04/12/20 0507 04/13/20 0257 04/14/20 0425  NA 145   < > 143  --   --  136 135 135 138  K 5.2*   < > 6.7*   < > 6.2* 5.6* 5.4* 4.9 4.3  CL 104   < > 102  --   --  92* 91* 96* 100  CO2 35*   < > 34*  --   --  31 29 30 30   GLUCOSE 134*   < > 144*  --   --  94 94 115* 148*  BUN 38*   < > 41*  --   --  31* 34* 38* 47*  CREATININE 0.77   < > 0.93  --   --  0.79 0.88 0.87 0.89  CALCIUM 8.8*   < > 8.7*  --   --  10.0 10.2 9.5 9.2  MG 2.2  --  2.2  --   --   --  1.6* 1.9 1.9  PHOS 3.5  --  5.1*  --   --   --  2.7 4.3 3.6   < > = values in this interval not displayed.   GFR: Estimated Creatinine Clearance: 78.3 mL/min (by C-G formula based on SCr of 0.89 mg/dL). Liver Function Tests: Recent Labs  Lab 04/08/20 0451 04/09/20 0424  AST 47* 34  ALT 84* 70*  ALKPHOS 56 51  BILITOT 0.5 0.6  PROT 5.8* 5.5*  ALBUMIN 2.8* 2.8*   No results for input(s): LIPASE, AMYLASE in the last 168 hours. No results for input(s): AMMONIA in the last 168 hours. Coagulation Profile: No results for input(s): INR, PROTIME  in the last 168 hours. Cardiac Enzymes: No results for input(s): CKTOTAL, CKMB, CKMBINDEX, TROPONINI in the last 168 hours. BNP (last 3 results) No results for input(s): PROBNP in the last 8760 hours. HbA1C: No results for input(s): HGBA1C in the last 72 hours. CBG: Recent Labs  Lab 04/12/20 0320 04/12/20 0731 04/12/20 1108 04/12/20 1533 04/12/20 1914  GLUCAP 92 103* 102* 129* 121*   Lipid Profile: No results for input(s): CHOL, HDL, LDLCALC, TRIG, CHOLHDL, LDLDIRECT in the last 72 hours. Thyroid Function Tests: No results for input(s): TSH, T4TOTAL, FREET4, T3FREE, THYROIDAB in the last 72 hours. Anemia Panel: No results for input(s): VITAMINB12, FOLATE, FERRITIN, TIBC, IRON, RETICCTPCT in the last 72 hours. Sepsis Labs: Recent Labs  Lab 04/08/20 0451  PROCALCITON 0.10    Recent Results (from the past 240 hour(s))  SARS Coronavirus 2 by RT PCR (hospital order, performed in [facility] hospital lab) Nasopharyngeal Nasopharyngeal Swab     Status: None   Collection Time: 04/06/20  2:09 PM   Specimen: Nasopharyngeal Swab  Result Value Ref Range Status   SARS Coronavirus 2 NEGATIVE NEGATIVE Final    Comment: (NOTE) SARS-CoV-2 target nucleic acids are NOT DETECTED.  The SARS-CoV-2 RNA is generally detectable in upper and lower respiratory specimens during the acute phase of infection. The lowest concentration of SARS-CoV-2 viral copies this assay can detect is 250 copies / mL. A negative result does not preclude SARS-CoV-2 infection and should not be used as the sole basis for treatment or other patient management decisions.  A negative result may occur with improper specimen collection / handling, submission of specimen other than nasopharyngeal swab, presence of viral mutation(s) within the areas targeted by this assay, and inadequate number of viral copies (<250 copies / mL). A negative result must be combined with clinical observations, patient history, and  epidemiological information.  Fact Sheet for Patients:   StrictlyIdeas.no  Fact Sheet for Healthcare Providers: BankingDealers.co.za  This test is not yet approved or  cleared by the [place] FDA and has been authorized for detection and/or diagnosis of SARS-CoV-2 by FDA under an Emergency Use Authorization (EUA).  This EUA will remain in effect (meaning this test can be used) for the duration of the COVID-19 declaration under Section 564(b)(1) of the Act, 21 U.S.C. section 360bbb-3(b)(1), unless the authorization is terminated or revoked sooner.  Performed at [facility], [address] [place], [place] [address]   Blood culture (routine x 2)     Status: None   Collection Time: 04/06/20  3:32 PM   Specimen: BLOOD  Result Value Ref Range Status   Specimen Description BLOOD RIGHT ANTECUBITAL  Final   Special Requests   Final    BOTTLES DRAWN AEROBIC AND ANAEROBIC Blood Culture adequate volume   Culture   Final    NO GROWTH 5 DAYS Performed at [facility], [address] [place], [place] [address]    Report Status 04/11/2020 FINAL  Final  Blood culture (routine x 2)     Status: None   Collection Time: 04/06/20  3:37 PM   Specimen: BLOOD  Result Value Ref Range Status   Specimen Description BLOOD LEFT ANTECUBITAL  Final   Special Requests   Final    BOTTLES DRAWN AEROBIC AND ANAEROBIC Blood Culture adequate volume   Culture   Final    NO GROWTH 5 DAYS Performed at [facility], [address] [place], [place] [address]    Report Status 04/11/2020 FINAL  Final  MRSA PCR Screening     Status: None   Collection Time: 04/07/20 12:43 AM   Specimen: Nasal Mucosa; Nasopharyngeal  Result Value Ref Range Status   MRSA by PCR NEGATIVE NEGATIVE Final    Comment:        The GeneXpert MRSA Assay (FDA approved for NASAL specimens only), is one component of a comprehensive MRSA  colonization surveillance program. It is not intended to diagnose MRSA infection nor to guide or monitor treatment for MRSA infections. Performed at [facility], [address], [place], [place] [address]          Radiology Studies: No results found.      Scheduled Meds: .  stroke: mapping  our early stages of recovery book   Does not apply Once  . atorvastatin  80 mg Per Tube Daily  . budesonide (PULMICORT) nebulizer solution  0.5 mg Nebulization BID  . Chlorhexidine Gluconate Cloth  6 each Topical Daily  . docusate  100 mg Per Tube BID  . enoxaparin (LOVENOX) injection  40 mg Subcutaneous Q24H  . feeding supplement (PRO-STAT 64)  30 mL Per Tube Daily  . ipratropium-albuterol  3 mL Nebulization TID  . mouth rinse  15 mL Mouth Rinse BID  . polyethylene glycol  17 g Per Tube Daily  . predniSONE  40 mg Oral Q breakfast   Continuous Infusions: . famotidine (PEPCID) IV 20 mg (04/14/20 0942)  . feeding supplement (VITAL AF 1.2 CAL) Stopped (04/11/20 0700)     LOS: 8 days    Time spent: 25 minutes    Sidney Ace, MD [facility] Pager [phone]  If 7PM-7AM, please contact night-coverage 04/14/2020, 10:52 AM

## 2020-04-14 NOTE — TOC Progression Note (Signed)
Transition of Care [facility]) - Progression Note    Patient Details  Name: Dakota Dunn MRN: [id number] Date of Birth: [birth year]  Transition of Care [facility]) CM/SW Contact  Shelbie Hutching, RN Phone Number: 04/14/2020, 3:52 PM  Clinical Narrative:    [facility] has offered a bed and it has been accepted.  [facility] authorization has been started.  Reference number for Josem Kaufmann is [id number].     Expected Discharge Plan: [place] Barriers to Discharge: Continued Medical Work up  Expected Discharge Plan and Services Expected Discharge Plan: [place]   Discharge Planning Services: CM Consult Post Acute Care Choice: [place] Living arrangements for the past 2 months: Single Family Home                                       Social Determinants of Health (SDOH) Interventions    Readmission Risk Interventions No flowsheet data found.

## 2020-04-15 DIAGNOSIS — J9601 Acute respiratory failure with hypoxia: Secondary | ICD-10-CM

## 2020-04-15 LAB — CBC WITH DIFFERENTIAL/PLATELET
Abs Immature Granulocytes: 0.05 10*3/uL (ref 0.00–0.07)
Basophils Absolute: 0 10*3/uL (ref 0.0–0.1)
Basophils Relative: 0 %
Eosinophils Absolute: 0 10*3/uL (ref 0.0–0.5)
Eosinophils Relative: 0 %
HCT: 55.9 % — ABNORMAL HIGH (ref 39.0–52.0)
Hemoglobin: 18.1 g/dL — ABNORMAL HIGH (ref 13.0–17.0)
Immature Granulocytes: 1 %
Lymphocytes Relative: 18 %
Lymphs Abs: 2 10*3/uL (ref 0.7–4.0)
MCH: 29.6 pg (ref 26.0–34.0)
MCHC: 32.4 g/dL (ref 30.0–36.0)
MCV: 91.5 fL (ref 80.0–100.0)
Monocytes Absolute: 1.6 10*3/uL — ABNORMAL HIGH (ref 0.1–1.0)
Monocytes Relative: 15 %
Neutro Abs: 7.3 10*3/uL (ref 1.7–7.7)
Neutrophils Relative %: 66 %
Platelets: 261 10*3/uL (ref 150–400)
RBC: 6.11 MIL/uL — ABNORMAL HIGH (ref 4.22–5.81)
RDW: 12.7 % (ref 11.5–15.5)
WBC: 11.1 10*3/uL — ABNORMAL HIGH (ref 4.0–10.5)
nRBC: 0 % (ref 0.0–0.2)

## 2020-04-15 LAB — PHOSPHORUS: Phosphorus: 3 mg/dL (ref 2.5–4.6)

## 2020-04-15 LAB — MAGNESIUM: Magnesium: 1.8 mg/dL (ref 1.7–2.4)

## 2020-04-15 MED ORDER — PREDNISONE 20 MG PO TABS: 40.0000 mg | ORAL_TABLET | Freq: Every day | ORAL | 0 refills | Status: AC

## 2020-04-15 MED ORDER — ATORVASTATIN CALCIUM 80 MG PO TABS: 80.0000 mg | ORAL_TABLET | Freq: Every day | ORAL | 0 refills | Status: DC

## 2020-04-15 MED ORDER — HALOPERIDOL LACTATE 5 MG/ML IJ SOLN
INTRAMUSCULAR | Status: AC
  Administered 2020-04-15: 2 mg via INTRAVENOUS
  Filled 2020-04-15: qty 1

## 2020-04-15 MED ORDER — HALOPERIDOL LACTATE 5 MG/ML IJ SOLN: 2.0000 mg | Freq: Four times a day (QID) | INTRAMUSCULAR | Status: DC | PRN

## 2020-04-15 NOTE — Discharge Instructions (Signed)
Acute Respiratory Failure, Adult  Acute respiratory failure occurs when there is not enough oxygen passing from your lungs to your body. When this happens, your lungs have trouble removing carbon dioxide from the blood. This causes your blood oxygen level to drop too low as carbon dioxide builds up. Acute respiratory failure is a medical emergency. It can develop quickly, but it is temporary if treated promptly. Your lung capacity, or how much air your lungs can hold, may improve with time, exercise, and treatment. What are the causes? There are many possible causes of acute respiratory failure, including:  Lung injury.  Chest injury or damage to the ribs or tissues near the lungs.  Lung conditions that affect the flow of air and blood into and out of the lungs, such as pneumonia, acute respiratory distress syndrome, and cystic fibrosis.  Medical conditions, such as strokes or spinal cord injuries, that affect the muscles and nerves that control breathing.  Blood infection (sepsis).  Inflammation of the pancreas (pancreatitis).  A blood clot in the lungs (pulmonary embolism).  A large-volume blood transfusion.  Burns.  Near-drowning.  Seizure.  Smoke inhalation.  Reaction to medicines.  Alcohol or drug overdose. What increases the risk? This condition is more likely to develop in people who have:  A blocked airway.  Asthma.  A condition or disease that damages or weakens the muscles, nerves, bones, or tissues that are involved in breathing.  A serious infection.  A health problem that blocks the unconscious reflex that is involved in breathing, such as hypothyroidism or sleep apnea.  A lung injury or trauma. What are the signs or symptoms? Trouble breathing is the main symptom of acute respiratory failure. Symptoms may also include:  Rapid breathing.  Restlessness or anxiety.  Skin, lips, or fingernails that appear blue (cyanosis).  Rapid heart  rate.  Abnormal heart rhythms (arrhythmias).  Confusion or changes in behavior.  Tiredness or loss of energy.  Feeling sleepy or having a loss of consciousness. How is this diagnosed? Your health care provider can diagnose acute respiratory failure with a medical history and physical exam. During the exam, your health care provider will listen to your heart and check for crackling or wheezing sounds in your lungs. Your may also have tests to confirm the diagnosis and determine what is causing respiratory failure. These tests may include:  Measuring the amount of oxygen in your blood (pulse oximetry). The measurement comes from a small device that is placed on your finger, earlobe, or toe.  Other blood tests to measure blood gases and to look for signs of infection.  Sampling your cerebral spinal fluid or tracheal fluid to check for infections.  Chest X-ray to look for fluid in spaces that should be filled with air.  Electrocardiogram (ECG) to look at the heart's electrical activity. How is this treated? Treatment for this condition usually takes places in a hospital intensive care unit (ICU). Treatment depends on what is causing the condition. It may include one or more treatments until your symptoms improve. Treatment may include:  Supplemental oxygen. Extra oxygen is given through a tube in the nose, a face mask, or a hood.  A device such as a continuous positive airway pressure (CPAP) or bi-level positive airway pressure (BiPAP or BPAP) machine. This treatment uses mild air pressure to keep the airways open. A mask or other device will be placed over your nose or mouth. A tube that is connected to a motor will deliver oxygen through the   mask.  Ventilator. This treatment helps move air into and out of the lungs. This may be done with a bag and mask or a machine. For this treatment, a tube is placed in your windpipe (trachea) so air and oxygen can flow to the lungs.  Extracorporeal  membrane oxygenation (ECMO). This treatment temporarily takes over the function of the heart and lungs, supplying oxygen and removing carbon dioxide. ECMO gives the lungs a chance to recover. It may be used if a ventilator is not effective.  Tracheostomy. This is a procedure that creates a hole in the neck to insert a breathing tube.  Receiving fluids and medicines.  Rocking the bed to help breathing. Follow these instructions at home:  Take over-the-counter and prescription medicines only as told by your health care provider.  Return to normal activities as told by your health care provider. Ask your health care provider what activities are safe for you.  Keep all follow-up visits as told by your health care provider. This is important. How is this prevented? Treating infections and medical conditions that may lead to acute respiratory failure can help prevent the condition from developing. Contact a health care provider if:  You have a fever.  Your symptoms do not improve or they get worse. Get help right away if:  You are having trouble breathing.  You lose consciousness.  Your have cyanosis or turn blue.  You develop a rapid heart rate.  You are confused. These symptoms may represent a serious problem that is an emergency. Do not wait to see if the symptoms will go away. Get medical help right away. Call your local emergency services (911 in the [place] Do not drive yourself to the hospital. This information is not intended to replace advice given to you by your health care provider. Make sure you discuss any questions you have with your health care provider. Document Revised: 08/26/2017 Document Reviewed: 03/31/2016 Elsevier Patient Education  2020 Elsevier Inc.  

## 2020-04-15 NOTE — TOC Transition Note (Signed)
Transition of Care [facility]) - CM/SW Discharge Note   Patient Details  Name: Dakota Dunn MRN: [id number] Date of Birth: 04-Jan-1951  Transition of Care [facility]) CM/SW Contact:  Shelbie Hutching, RN Phone Number: 04/15/2020, 1:09 PM   Clinical Narrative:    Transport is set up with [facility].  Patient is 4th on the list for pick up.    Final next level of care: Skilled Nursing Facility Barriers to Discharge: Barriers Resolved   Patient Goals and CMS Choice Patient states their goals for this hospitalization and ongoing recovery are:: to go home CMS Medicare.gov Compare Post Acute Care list provided to:: Patient Choice offered to / list presented to : Patient  Discharge Placement PASRR number recieved: 04/14/20            Patient chooses bed at: [facility] Patient to be transferred to facility by: [facility] Name of family member notified: wife Dakota Dunn Patient and family notified of of transfer: 04/15/20  Discharge Plan and Services   Discharge Planning Services: CM Consult Post Acute Care Choice: [place]                               Social Determinants of Health (SDOH) Interventions     Readmission Risk Interventions No flowsheet data found.

## 2020-04-15 NOTE — Discharge Summary (Addendum)
Physician Discharge Summary  Dakota Dunn JHE:[id number] DOB: [birth year] DOA: 04/06/2020  PCP: Patient, No Pcp Per  Admit date: 04/06/2020 Discharge date: 04/15/2020  Admitted From: Home Disposition:  SNF  Recommendations for Outpatient Follow-up:  1. Follow up with PCP in 1-2 weeks 2.   Home Health:No Equipment/Devices:None Discharge Condition:Stable CODE STATUS:DNR Diet recommendation: Heart Healthy / Carb Modified Brief/Interim Summary: 69 yo male admitted with acute vs. subacute cortical infarct, elevated troponin's, and acute hypercapnic respiratory failure secondary to vascular congestion and suspected undiagnosed COPD exacerbation requiring mechanical intubation 07/11: Pt presented to [facility] ER with AMS and acute respiratory failure  07/11: CT Head revealed small focal cortical hypodensity right frontal lobe which may reflect acute or subacute cortical infarct. No evidence of hemorrhage.Neurology consulted recommended MR Brain 07/11: US Venous BLE negative for DVT 07/11: Pt initially admitted to the stepdown unit, however while awaiting for stepdown bed availability he decompensated secondary to respiratory acidosis requiring mechanical intubation and transfer to ICU 7/12 multiorgan failure DNR/DNI 7/13 MRI reports are NEG for acute CVA 7/14 plan for SAT/SBT today 7/14 failed SAT/SBT , family at bedisde witnessed 7/15 failed SAT/SBT, family at bedside witnessed 7/16: Extubated, care transferred to hospitalist service 7/17: Patient seen and examined in ICU. Respiratory status markedly improved. Mental status also improved. 7/18: Examined.  MedSurg status.  Respiratory status improved.  Mental status improved. 7/19: Patient seen and examined. Mental status improving. Respiratory status at baseline. Weaned off supplemental oxygen. Physical therapy worked with patient yesterday. Recommended skilled nursing facility placement. Discussed this with patient who is in  agreement. 7/20: Patient seen and examined.  Mental status at baseline.  Respiratory status improved.  Weaned off supplemental oxygen.  Stable for skilled nursing facility discharge.  Discharge Diagnoses:  Active Problems:   Stroke [facility]) Severe acute hypoxic and hypercapnic respiratory failure Successfully extubated on 04/11/2020 Respiratory status stable for now but tenuous considering severity of respiratory failure Plan: Wean O2 as tolerated Complete additional 3 days of prednisone post discharge  Acute exacerbation of diastolic congestive heart failure Acute NSTEMI Stabilizing from cardiac standpoint Lasix if needed No further cardiac diagnostics Cardiology signed off  Acute renal failure Likely secondary to above Improving   Discharge Instructions  Discharge Instructions    Diet - low sodium heart healthy   Complete by: As directed    Increase activity slowly   Complete by: As directed      Allergies as of 04/15/2020   No Known Allergies     Medication List    TAKE these medications   atorvastatin 80 MG tablet Commonly known as: LIPITOR Take 1 tablet (80 mg total) by mouth daily. Start taking on: April 16, 2020   irbesartan 150 MG tablet Commonly known as: AVAPRO Take 150 mg by mouth daily.   predniSONE 20 MG tablet Commonly known as: DELTASONE Take 2 tablets (40 mg total) by mouth daily with breakfast for 3 days. Start taking on: April 16, 2020       Contact information for follow-up providers    Corey Skains, MD Follow up.   Specialty: Cardiology Contact information: [facility] [place] [address] [phone]            Contact information for after-discharge care    Destination    [facility] Preferred SNF .   Service: Skilled Nursing Contact information: [address] [place] [phone]  No Known  Allergies  Consultations:  Cardiology  Neurology  PCCM   Procedures/Studies: CT Head Wo Contrast  Result Date: 04/06/2020 CLINICAL DATA:  Increasing weakness, altered level of consciousness, difficulty speaking for several days EXAM: CT HEAD WITHOUT CONTRAST TECHNIQUE: Contiguous axial images were obtained from the base of the skull through the vertex without intravenous contrast. COMPARISON:  None. FINDINGS: Brain: There is a focal area of cortical hypodensity in the right frontal lobe, reference images 11-13 of series 3, which could reflect an area of acute or subacute infarct. No evidence of hemorrhage. Lateral ventricles and midline structures are unremarkable. No acute extra-axial fluid collections. No mass effect. Vascular: No hyperdense vessel or unexpected calcification. Skull: Normal. Negative for fracture or focal lesion. Sinuses/Orbits: No acute finding. Other: None. IMPRESSION: 1. Small focal cortical hypodensity right frontal lobe which may reflect acute or subacute cortical infarct. 2. No evidence of hemorrhage. Electronically Signed   By: Randa Ngo M.D.   On: 04/06/2020 15:41   MR BRAIN WO CONTRAST  Result Date: 04/07/2020 CLINICAL DATA:  Altered mental status. Worsening weakness and speech disturbance. EXAM: MRI HEAD WITHOUT CONTRAST TECHNIQUE: Multiplanar, multiecho pulse sequences of the brain and surrounding structures were obtained without intravenous contrast. COMPARISON:  Head CT yesterday. FINDINGS: Brain: Diffusion imaging does not show any acute or subacute infarction. Brainstem and cerebellum are normal. Cerebral hemispheres show mild small vessel change the white matter. No cortical or large vessel territory infarction. No mass lesion, hemorrhage, hydrocephalus or extra-axial collection. There is cerebellar tonsillar extension through the foramen magnum of 1.3 Cm consistent with chronic Chiari malformation. No sign of upper cervical cord syrinx. Vascular: Major  vessels at the base of the brain show flow. Skull and upper cervical spine: Negative Sinuses/Orbits: Clear/normal Other: None IMPRESSION: No acute finding by MRI. 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 Mild chronic small-vessel change of the cerebral hemispheric white matter, often seen at this age. Electronically Signed   By: Nelson Chimes M.D.   On: 04/07/2020 14:31   US Carotid Bilateral (at [facility] and [facility] only)  Result Date: 04/06/2020 CLINICAL DATA:  Stroke.  Hypertension, history of tobacco abuse EXAM: BILATERAL CAROTID DUPLEX ULTRASOUND TECHNIQUE: Pearline Cables scale imaging, color Doppler and duplex ultrasound were performed of bilateral carotid and vertebral arteries in the neck. COMPARISON:  None. FINDINGS: Criteria: Quantification of carotid stenosis is based on velocity parameters that correlate the residual internal carotid diameter with NASCET-based stenosis levels, using the diameter of the distal internal carotid lumen as the denominator for stenosis measurement. The following velocity measurements were obtained: RIGHT ICA: 109/45 cm/sec CCA: 17/51 cm/sec SYSTOLIC ICA/CCA RATIO:  1.6 ECA: 172 cm/sec LEFT ICA: 95/41 cm/sec CCA: 02/58 cm/sec SYSTOLIC ICA/CCA RATIO:  1.7 ECA: 69 cm/sec RIGHT CAROTID ARTERY: Eccentric plaque in the bulb with only mild stenosis. Normal waveforms and color Doppler signal throughout. RIGHT VERTEBRAL ARTERY:  Normal flow direction and waveform. LEFT CAROTID ARTERY: Eccentric plaque in the bulb and ICA origin with only mild stenosis. Normal waveforms and color Doppler signal throughout. LEFT VERTEBRAL ARTERY:  Normal flow direction and waveform. IMPRESSION: 1. Bilateral carotid bifurcation plaque resulting in less than 50% diameter ICA stenosis. 2. Antegrade bilateral vertebral arterial flow. Electronically Signed   By: Lucrezia Europe M.D.   On: 04/06/2020 21:08   US Venous Img Lower Bilateral (DVT)  Result Date:  04/06/2020 CLINICAL DATA:  Edema EXAM: BILATERAL LOWER EXTREMITY VENOUS DOPPLER ULTRASOUND TECHNIQUE: Gray-scale sonography with compression,  as well as color and duplex ultrasound, were performed to evaluate the deep venous system(s) from the level of the common femoral vein through the popliteal and proximal calf veins. COMPARISON:  None. FINDINGS: VENOUS Normal compressibility of the common femoral, superficial femoral, and popliteal veins, as well as the visualized calf veins. Visualized portions of profunda femoral vein and great saphenous vein unremarkable. No filling defects to suggest DVT on grayscale or color Doppler imaging. Doppler waveforms show normal direction of venous flow, normal respiratory plasticity and response to augmentation. OTHER None. Limitations: none IMPRESSION: Negative. Electronically Signed   By: Constance Holster M.D.   On: 04/06/2020 18:57   DG Chest Port 1 View  Result Date: 04/09/2020 CLINICAL DATA:  Acute respiratory failure EXAM: PORTABLE CHEST 1 VIEW COMPARISON:  Three days ago FINDINGS: Endotracheal tube tip is just below the clavicular heads. The enteric tube tip reaches the stomach with side port near the GE junction. Normal heart size and mediastinal contours. No visible effusion or pneumothorax. Mild interstitial coarsening without Kerley lines. No consolidation. IMPRESSION: Stable hardware positioning. Interstitial prominence but no edema or focal pneumonia. Electronically Signed   By: Monte Fantasia M.D.   On: 04/09/2020 07:21   DG Chest Portable 1 View  Result Date: 04/06/2020 CLINICAL DATA:  69 year old male intubated. EXAM: PORTABLE CHEST 1 VIEW COMPARISON:  Portable chest 1427 hours today. FINDINGS: Portable AP supine view at 2128 hours. Intubated and enteric tube in place. Endotracheal tube tip projects between the clavicles and carina. Enteric tube terminates in the left upper quadrant, side hole at the level of the proximal gastric body. Stable lung  volumes and ventilation. Mildly increased pulmonary interstitial markings are stable and nonspecific. Normal cardiac size and mediastinal contours. Negative visible bowel gas pattern. No acute osseous abnormality identified. IMPRESSION: 1. Satisfactory ET tube and enteric tube placement. 2. Stable ventilation with mildly increased pulmonary interstitial markings. Electronically Signed   By: Genevie Ann M.D.   On: 04/06/2020 21:47   DG Chest Port 1 View  Result Date: 04/06/2020 CLINICAL DATA:  Short of breath, bilateral lower extremity edema, productive cough EXAM: PORTABLE CHEST 1 VIEW COMPARISON:  None. FINDINGS: 2 frontal views of the chest demonstrate an unremarkable cardiac silhouette. There is central vascular congestion without airspace disease, effusion, or pneumothorax. No acute bony abnormalities. IMPRESSION: 1. Central vascular congestion without overt edema. Electronically Signed   By: Randa Ngo M.D.   On: 04/06/2020 14:55   ECHOCARDIOGRAM COMPLETE  Result Date: 04/08/2020    ECHOCARDIOGRAM REPORT   Patient Name:   Dakota Dunn Date of Exam: 04/07/2020 Medical Rec #:  [id number]        Height:       69.0 in Accession #:    [id number]       Weight:       192.9 lb Date of Birth:  [birth year]         BSA:          2.035 m Patient Age:    51 years         BP:           103/57 mmHg Patient Gender: M                HR:           92 bpm. Exam Location:  [facility] Procedure: 2D Echo, Cardiac Doppler and Limited Color Doppler Indications:     I21.4 NSTEMI; I163.9 Stroke  History:  Patient has no prior history of Echocardiogram examinations.                  Risk Factors:Hypertension.  Sonographer:     Charmayne Sheer RDCS (AE) Referring Phys:  [id number] [facility] Diagnosing Phys: Yolonda Kida MD  Sonographer Comments: Technically difficult study due to poor echo windows, no apical window, suboptimal parasternal window and echo performed with patient supine and on artificial respirator. IMPRESSIONS  1.  Left ventricular ejection fraction, by estimation, is 50 to 55%. The left ventricle has normal function. The left ventricle has no regional wall motion abnormalities. Left ventricular diastolic parameters were normal.  2. Right ventricular systolic function is normal. The right ventricular size is normal.  3. The mitral valve is normal in structure. No evidence of mitral valve regurgitation.  4. The aortic valve is normal in structure. Aortic valve regurgitation is not visualized. FINDINGS  Left Ventricle: Left ventricular ejection fraction, by estimation, is 50 to 55%. The left ventricle has normal function. The left ventricle has no regional wall motion abnormalities. The left ventricular internal cavity size was normal in size. There is  no left ventricular hypertrophy. Left ventricular diastolic parameters were normal. Right Ventricle: The right ventricular size is normal. No increase in right ventricular wall thickness. Right ventricular systolic function is normal. Left Atrium: Left atrial size was normal in size. Right Atrium: Right atrial size was normal in size. Pericardium: There is no evidence of pericardial effusion. Mitral Valve: The mitral valve is normal in structure. No evidence of mitral valve regurgitation. Tricuspid Valve: The tricuspid valve is normal in structure. Tricuspid valve regurgitation is trivial. Aortic Valve: The aortic valve is normal in structure. Aortic valve regurgitation is not visualized. Pulmonic Valve: The pulmonic valve was normal in structure. Pulmonic valve regurgitation is not visualized. Aorta: The aortic root is normal in size and structure. IAS/Shunts: No atrial level shunt detected by color flow Doppler.  LEFT VENTRICLE PLAX 2D LVIDd:         4.25 cm LVIDs:         3.19 cm LV PW:         1.00 cm LV IVS:        0.86 cm LVOT diam:     2.10 cm LVOT Area:     3.46 cm  LEFT ATRIUM         Index LA diam:    3.10 cm 1.52 cm/m                        PULMONIC VALVE AORTA                  PV Vmax:       1.36 m/s Ao Root diam: 2.90 cm PV Vmean:      89.700 cm/s                       PV VTI:        0.233 m                       PV Peak grad:  7.4 mmHg                       PV Mean grad:  4.0 mmHg   SHUNTS Systemic Diam: 2.10 cm Dwayne Prince Rome MD Electronically signed by Yolonda Kida MD Signature Date/Time: 04/08/2020/1:52:22 PM  Final     (Echo, Carotid, EGD, Colonoscopy, ERCP)    Subjective:   Discharge Exam: Vitals:   04/14/20 2341 04/15/20 0808  BP: 132/78 122/76  Pulse: (!) 58 (!) 57  Resp: 20 17  Temp: 98 F (36.7 C) 98.3 F (36.8 C)  SpO2: 97% 95%   Vitals:   04/14/20 2011 04/14/20 2014 04/14/20 2341 04/15/20 0808  BP:   132/78 122/76  Pulse: 65  (!) 58 (!) 57  Resp:   20 17  Temp:   98 F (36.7 C) 98.3 F (36.8 C)  TempSrc:   Oral   SpO2: 92% 93% 97% 95%  Weight:      Height:        General: Pt is alert, awake, not in acute distress Cardiovascular: RRR, S1/S2 +, no rubs, no gallops Respiratory: CTA bilaterally, no wheezing, no rhonchi Abdominal: Soft, NT, ND, bowel sounds + Extremities: no edema, no cyanosis    The results of significant diagnostics from this hospitalization (including imaging, microbiology, ancillary and laboratory) are listed below for reference.     Microbiology: Recent Results (from the past 240 hour(s))  SARS Coronavirus 2 by RT PCR (hospital order, performed in [facility] hospital lab) Nasopharyngeal Nasopharyngeal Swab     Status: None   Collection Time: 04/06/20  2:09 PM   Specimen: Nasopharyngeal Swab  Result Value Ref Range Status   SARS Coronavirus 2 NEGATIVE NEGATIVE Final    Comment: (NOTE) SARS-CoV-2 target nucleic acids are NOT DETECTED.  The SARS-CoV-2 RNA is generally detectable in upper and lower respiratory specimens during the acute phase of infection. The lowest concentration of SARS-CoV-2 viral copies this assay can detect is 250 copies / mL. A negative result does not preclude  SARS-CoV-2 infection and should not be used as the sole basis for treatment or other patient management decisions.  A negative result may occur with improper specimen collection / handling, submission of specimen other than nasopharyngeal swab, presence of viral mutation(s) within the areas targeted by this assay, and inadequate number of viral copies (<250 copies / mL). A negative result must be combined with clinical observations, patient history, and epidemiological information.  Fact Sheet for Patients:   StrictlyIdeas.no  Fact Sheet for Healthcare Providers: BankingDealers.co.za  This test is not yet approved or  cleared by the [place] FDA and has been authorized for detection and/or diagnosis of SARS-CoV-2 by FDA under an Emergency Use Authorization (EUA).  This EUA will remain in effect (meaning this test can be used) for the duration of the COVID-19 declaration under Section 564(b)(1) of the Act, 21 U.S.C. section 360bbb-3(b)(1), unless the authorization is terminated or revoked sooner.  Performed at [facility], [address] [place], [place] [address]   Blood culture (routine x 2)     Status: None   Collection Time: 04/06/20  3:32 PM   Specimen: BLOOD  Result Value Ref Range Status   Specimen Description BLOOD RIGHT ANTECUBITAL  Final   Special Requests   Final    BOTTLES DRAWN AEROBIC AND ANAEROBIC Blood Culture adequate volume   Culture   Final    NO GROWTH 5 DAYS Performed at [facility], [address] [place], [place] [address]    Report Status 04/11/2020 FINAL  Final  Blood culture (routine x 2)     Status: None   Collection Time: 04/06/20  3:37 PM   Specimen: BLOOD  Result Value Ref Range Status   Specimen Description BLOOD LEFT  ANTECUBITAL  Final   Special Requests   Final    BOTTLES DRAWN AEROBIC AND ANAEROBIC Blood Culture adequate volume   Culture   Final    NO GROWTH 5  DAYS Performed at [facility], [address] [place], [place] [address]    Report Status 04/11/2020 FINAL  Final  MRSA PCR Screening     Status: None   Collection Time: 04/07/20 12:43 AM   Specimen: Nasal Mucosa; Nasopharyngeal  Result Value Ref Range Status   MRSA by PCR NEGATIVE NEGATIVE Final    Comment:        The GeneXpert MRSA Assay (FDA approved for NASAL specimens only), is one component of a comprehensive MRSA colonization surveillance program. It is not intended to diagnose MRSA infection nor to guide or monitor treatment for MRSA infections. Performed at [facility], [address] [place], [place] [address]      Labs: BNP (last 3 results) Recent Labs    04/06/20 1409  BNP 9,983.3*   Basic Metabolic Panel: Recent Labs  Lab 04/11/20 0400 04/11/20 0904 04/11/20 1351 04/11/20 2122 04/12/20 0507 04/13/20 0257 04/14/20 0425 04/15/20 0431  NA 143  --   --  136 135 135 138  --   K 6.7*   < > 6.2* 5.6* 5.4* 4.9 4.3  --   CL 102  --   --  92* 91* 96* 100  --   CO2 34*  --   --  31 29 30 30   --   GLUCOSE 144*  --   --  94 94 115* 148*  --   BUN 41*  --   --  31* 34* 38* 47*  --   CREATININE 0.93  --   --  0.79 0.88 0.87 0.89  --   CALCIUM 8.7*  --   --  10.0 10.2 9.5 9.2  --   MG 2.2  --   --   --  1.6* 1.9 1.9 1.8  PHOS 5.1*  --   --   --  2.7 4.3 3.6 3.0   < > = values in this interval not displayed.   Liver Function Tests: Recent Labs  Lab 04/09/20 0424  AST 34  ALT 70*  ALKPHOS 51  BILITOT 0.6  PROT 5.5*  ALBUMIN 2.8*   No results for input(s): LIPASE, AMYLASE in the last 168 hours. No results for input(s): AMMONIA in the last 168 hours. CBC: Recent Labs  Lab 04/11/20 0400 04/12/20 0507 04/13/20 0257 04/14/20 0425 04/15/20 0431  WBC 10.6* 13.4* 11.1* 9.3 11.1*  NEUTROABS 9.4* 10.8* 9.1* 7.6 7.3  HGB 15.2 17.7* 18.2* 18.6* 18.1*  HCT 49.1 55.8* 56.0* 55.0* 55.9*  MCV 97.0 93.8 91.1 89.0 91.5  PLT 241 264 274  280 261   Cardiac Enzymes: No results for input(s): CKTOTAL, CKMB, CKMBINDEX, TROPONINI in the last 168 hours. BNP: Invalid input(s): POCBNP CBG: Recent Labs  Lab 04/12/20 0320 04/12/20 0731 04/12/20 1108 04/12/20 1533 04/12/20 1914  GLUCAP 92 103* 102* 129* 121*   D-Dimer No results for input(s): DDIMER in the last 72 hours. Hgb A1c No results for input(s): HGBA1C in the last 72 hours. Lipid Profile No results for input(s): CHOL, HDL, LDLCALC, TRIG, CHOLHDL, LDLDIRECT in the last 72 hours. Thyroid function studies No results for input(s): TSH, T4TOTAL, T3FREE, THYROIDAB in the last 72 hours.  Invalid input(s): FREET3 Anemia work up No results for input(s): VITAMINB12, FOLATE, FERRITIN, TIBC, IRON, RETICCTPCT in the last  72 hours. Urinalysis    Component Value Date/Time   COLORURINE YELLOW (A) 04/06/2020 1546   APPEARANCEUR CLEAR (A) 04/06/2020 1546   LABSPEC 1.008 04/06/2020 1546   PHURINE 5.0 04/06/2020 1546   GLUCOSEU NEGATIVE 04/06/2020 1546   HGBUR NEGATIVE 04/06/2020 1546   BILIRUBINUR NEGATIVE 04/06/2020 1546   KETONESUR NEGATIVE 04/06/2020 1546   PROTEINUR NEGATIVE 04/06/2020 1546   NITRITE NEGATIVE 04/06/2020 1546   LEUKOCYTESUR NEGATIVE 04/06/2020 1546   Sepsis Labs Invalid input(s): PROCALCITONIN,  WBC,  LACTICIDVEN Microbiology Recent Results (from the past 240 hour(s))  SARS Coronavirus 2 by RT PCR (hospital order, performed in [facility] hospital lab) Nasopharyngeal Nasopharyngeal Swab     Status: None   Collection Time: 04/06/20  2:09 PM   Specimen: Nasopharyngeal Swab  Result Value Ref Range Status   SARS Coronavirus 2 NEGATIVE NEGATIVE Final    Comment: (NOTE) SARS-CoV-2 target nucleic acids are NOT DETECTED.  The SARS-CoV-2 RNA is generally detectable in upper and lower respiratory specimens during the acute phase of infection. The lowest concentration of SARS-CoV-2 viral copies this assay can detect is 250 copies / mL. A negative result  does not preclude SARS-CoV-2 infection and should not be used as the sole basis for treatment or other patient management decisions.  A negative result may occur with improper specimen collection / handling, submission of specimen other than nasopharyngeal swab, presence of viral mutation(s) within the areas targeted by this assay, and inadequate number of viral copies (<250 copies / mL). A negative result must be combined with clinical observations, patient history, and epidemiological information.  Fact Sheet for Patients:   StrictlyIdeas.no  Fact Sheet for Healthcare Providers: BankingDealers.co.za  This test is not yet approved or  cleared by the [place] FDA and has been authorized for detection and/or diagnosis of SARS-CoV-2 by FDA under an Emergency Use Authorization (EUA).  This EUA will remain in effect (meaning this test can be used) for the duration of the COVID-19 declaration under Section 564(b)(1) of the Act, 21 U.S.C. section 360bbb-3(b)(1), unless the authorization is terminated or revoked sooner.  Performed at [facility], [address] [place], [place] [address]   Blood culture (routine x 2)     Status: None   Collection Time: 04/06/20  3:32 PM   Specimen: BLOOD  Result Value Ref Range Status   Specimen Description BLOOD RIGHT ANTECUBITAL  Final   Special Requests   Final    BOTTLES DRAWN AEROBIC AND ANAEROBIC Blood Culture adequate volume   Culture   Final    NO GROWTH 5 DAYS Performed at [facility], [address] [place], [place] [address]    Report Status 04/11/2020 FINAL  Final  Blood culture (routine x 2)     Status: None   Collection Time: 04/06/20  3:37 PM   Specimen: BLOOD  Result Value Ref Range Status   Specimen Description BLOOD LEFT ANTECUBITAL  Final   Special Requests   Final    BOTTLES DRAWN AEROBIC AND ANAEROBIC Blood Culture adequate volume   Culture   Final     NO GROWTH 5 DAYS Performed at [facility], [address], [place], [place] [address]    Report Status 04/11/2020 FINAL  Final  MRSA PCR Screening     Status: None   Collection Time: 04/07/20 12:43 AM   Specimen: Nasal Mucosa; Nasopharyngeal  Result Value Ref Range Status   MRSA by PCR NEGATIVE NEGATIVE Final    Comment:  The GeneXpert MRSA Assay (FDA approved for NASAL specimens only), is one component of a comprehensive MRSA colonization surveillance program. It is not intended to diagnose MRSA infection nor to guide or monitor treatment for MRSA infections. Performed at [facility], [address], [place], [place] [address]      Time coordinating discharge: Over 30 minutes  SIGNED:   Sidney Ace, MD  [facility] 04/15/2020, 10:57 AM Pager   If 7PM-7AM, please contact night-coverage

## 2020-04-15 NOTE — TOC Transition Note (Signed)
Transition of Care [facility]) - CM/SW Discharge Note   Patient Details  Name: Dakota Dunn MRN: [id number] Date of Birth: [birth year]  Transition of Care [facility]) CM/SW Contact:  Shelbie Hutching, RN Phone Number: 04/15/2020, 12:14 PM   Clinical Narrative:    Patient is medically ready for discharge to [facility], going to room 501B.  Bedside RN to call report to [phone]. Patient will transport via [place], this RNCM will set up transport.  Family is aware of discharge plan.     Final next level of care: Skilled Nursing Facility Barriers to Discharge: Barriers Resolved   Patient Goals and CMS Choice Patient states their goals for this hospitalization and ongoing recovery are:: to go home CMS Medicare.gov Compare Post Acute Care list provided to:: Patient Choice offered to / list presented to : Patient  Discharge Placement PASRR number recieved: 04/14/20            Patient chooses bed at: [facility] [place] Patient to be transferred to facility by: [facility] Name of family member notified: wife [name] Patient and family notified of of transfer: 04/15/20  Discharge Plan and Services   Discharge Planning Services: CM Consult Post Acute Care Choice: [place]                               Social Determinants of Health (SDOH) Interventions     Readmission Risk Interventions No flowsheet data found.

## 2021-07-26 LAB — COLOGUARD: COLOGUARD: POSITIVE — AB

## 2021-08-28 ENCOUNTER — Other Ambulatory Visit: Payer: Self-pay | Admitting: General Surgery

## 2021-08-28 NOTE — Progress Notes (Deleted)
Subjective:     Patient ID: Dakota Dunn is a 70 y.o. male.   HPI      Chief Complaint  Patient presents with   Lipoma      The following portions of the patient's history were reviewed and updated as appropriate.   This a new patient is here today for: office visit.here for evaluation of a lipoma right upper arm referred by Sherrie Mustache NP.   Patient reports that she does not feel pain but it does bother her at night when she rolls over on to it.    The patient reports this has been present for several years.  When first discovered, the sizable walnut and asymptomatic.   Review of Systems     No Known Allergies         BP 122/80   Pulse 88   Temp 36.7 C (98 F)   Ht 162.6 cm (5\' 4" )   Wt 84.4 kg (186 lb)   LMP 12/27/1997 (Approximate)   SpO2 97%   BMI 31.93 kg/m        Past Medical History:  Diagnosis Date   Thyroid disorder             Past Surgical History:  Procedure Laterality Date   CHOLECYSTECTOMY   2003   [place]        OB History   No obstetric history on file.     Obstetric Comments  Age at first period Age of first pregnancy             Social History          Socioeconomic History   Marital status: Married  Tobacco Use   Smoking status: Never   Smokeless tobacco: Never  Substance and Sexual Activity   Alcohol use: Never   Drug use: Never        No Known Allergies   Current Medications        Current Outpatient Medications  Medication Sig Dispense Refill   levothyroxine (SYNTHROID, LEVOTHROID) 75 MCG tablet 1 tab by mouth daily   5   magnesium oxide (MAG-OX) 400 mg (241.3 mg magnesium) tablet Take by mouth        No current facility-administered medications for this visit.             Family History  Problem Relation Age of Onset   Emphysema Mother     Diabetes Brother     Breast cancer Neg Hx     Colon cancer Neg Hx          Labs and Radiology:    December 06, 2020 laboratory  review:   Globulin                       1.8  GFR calc Af Amer                   63  GFR calc non Af Amer                        34  Calcium           8.7 - 10.7         9.0  Albumin                        Hemoglobin     12.0 - 16.0  13.5  HCT     36 - 46 39  Neutrophils Absolute               3,270.00  Platelets          150 - 399         179  WBC                          Objective:   Physical Exam Exam conducted with a chaperone present.  Constitutional:      Appearance: Normal appearance.  Cardiovascular:     Rate and Rhythm: Normal rate and regular rhythm.     Pulses: Normal pulses.     Heart sounds: Normal heart sounds.  Pulmonary:     Effort: Pulmonary effort is normal.     Breath sounds: Normal breath sounds.  Musculoskeletal:       Arms:     Cervical back: Neck supple.     Comments: 8 cm mass on the upper arm below the insertion of the deltoid measuring 8 x 8 cm.  No evidence of fixation to the muscle fascia.  No distal venous engorgement.  Normal distal vascular exam.  Normal upper extremity muscle strength testing.  Skin:    General: Skin is warm and dry.  Neurological:     Mental Status: She is alert and oriented to person, place, and time.  Psychiatric:        Mood and Affect: Mood normal.        Behavior: Behavior normal.           Assessment:     Enlarging soft tissue mass of the right upper extremity.    Plan:     Indications for surgical removal were reviewed.  The area extends to just below the sleeve length of a short sleeve blouse that she is wearing.  I would prefer a vertical incision over the area to minimize disruption of cutaneous nerves rather than transverse incision which can cause more discomfort especially with distal swelling from lymphatic blockage.  She is amenable to the idea of this plan.  Considering the size I think she would do well with local anesthesia with sedation/general anesthesia rather than straight local anesthesia.   No contraindication to general anesthesia.   This note is partially prepared by Karie Fetch, RN, acting as a scribe in the presence of Dr. Hervey Ard, MD.  The documentation recorded by the scribe accurately reflects the service I personally performed and the decisions made by me.    Robert Bellow, MD FACS

## 2021-09-02 ENCOUNTER — Ambulatory Visit
Admission: RE | Admit: 2021-09-02 | Discharge: 2021-09-02 | Disposition: A | Discharge status: Home / Self Care | Payer: Medicare Other | Attending: General Surgery | Admitting: General Surgery

## 2021-09-02 ENCOUNTER — Encounter: Payer: Self-pay | Admitting: General Surgery

## 2021-09-02 ENCOUNTER — Ambulatory Visit: Payer: Medicare Other | Admitting: Certified Registered"

## 2021-09-02 ENCOUNTER — Encounter
Admission: RE | Disposition: A | Discharge status: Home / Self Care | Payer: Self-pay | Source: Home / Self Care | Attending: General Surgery

## 2021-09-02 DIAGNOSIS — R195 Other fecal abnormalities: Secondary | ICD-10-CM | POA: Insufficient documentation

## 2021-09-02 DIAGNOSIS — D120 Benign neoplasm of cecum: Secondary | ICD-10-CM | POA: Diagnosis not present

## 2021-09-02 DIAGNOSIS — I10 Essential (primary) hypertension: Secondary | ICD-10-CM | POA: Diagnosis not present

## 2021-09-02 DIAGNOSIS — D124 Benign neoplasm of descending colon: Secondary | ICD-10-CM | POA: Diagnosis not present

## 2021-09-02 DIAGNOSIS — D123 Benign neoplasm of transverse colon: Secondary | ICD-10-CM | POA: Insufficient documentation

## 2021-09-02 DIAGNOSIS — J449 Chronic obstructive pulmonary disease, unspecified: Secondary | ICD-10-CM | POA: Insufficient documentation

## 2021-09-02 DIAGNOSIS — K641 Second degree hemorrhoids: Secondary | ICD-10-CM | POA: Insufficient documentation

## 2021-09-02 DIAGNOSIS — F17210 Nicotine dependence, cigarettes, uncomplicated: Secondary | ICD-10-CM | POA: Insufficient documentation

## 2021-09-02 HISTORY — PX: COLONOSCOPY WITH PROPOFOL: SHX5780

## 2021-09-02 SURGERY — COLONOSCOPY WITH PROPOFOL
Anesthesia: General

## 2021-09-02 MED ORDER — LIDOCAINE 2% (20 MG/ML) 5 ML SYRINGE
INTRAMUSCULAR | Status: DC | PRN
  Administered 2021-09-02: 20 mg via INTRAVENOUS

## 2021-09-02 MED ORDER — PROPOFOL 500 MG/50ML IV EMUL
INTRAVENOUS | Status: DC | PRN
  Administered 2021-09-02: 120 ug/kg/min via INTRAVENOUS

## 2021-09-02 MED ORDER — EPHEDRINE SULFATE 50 MG/ML IJ SOLN
INTRAMUSCULAR | Status: DC | PRN
  Administered 2021-09-02: 10 mg via INTRAVENOUS

## 2021-09-02 MED ORDER — SODIUM CHLORIDE 0.9 % IV SOLN: INTRAVENOUS | Status: DC

## 2021-09-02 MED ORDER — PROPOFOL 10 MG/ML IV BOLUS
INTRAVENOUS | Status: DC | PRN
  Administered 2021-09-02: 30 mg via INTRAVENOUS
  Administered 2021-09-02: 70 mg via INTRAVENOUS
  Administered 2021-09-02: 20 mg via INTRAVENOUS

## 2021-09-02 NOTE — Anesthesia Preprocedure Evaluation (Signed)
Anesthesia Evaluation  Patient identified by MRN, date of birth, ID band Patient awake    Reviewed: Allergy & Precautions, NPO status , Patient's Chart, lab work & pertinent test results  History of Anesthesia Complications Negative for: history of anesthetic complications  Airway Mallampati: III  TM Distance: >3 FB Neck ROM: full    Dental  (+) Chipped, Poor Dentition, Missing   Pulmonary neg shortness of breath, COPD, Current Smoker and Patient abstained from smoking.,    Pulmonary exam normal        Cardiovascular Exercise Tolerance: Good hypertension, (-) angina(-) Past MI Normal cardiovascular exam     Neuro/Psych CVA negative psych ROS   GI/Hepatic negative GI ROS, Neg liver ROS, neg GERD  ,  Endo/Other  negative endocrine ROS  Renal/GU negative Renal ROS  negative genitourinary   Musculoskeletal   Abdominal   Peds  Hematology negative hematology ROS (+)   Anesthesia Other Findings Past Medical History: No date: Hypertension  History reviewed. No pertinent surgical history.  BMI    Body Mass Index: 32.93 kg/m      Reproductive/Obstetrics negative OB ROS                             Anesthesia Physical Anesthesia Plan  ASA: 3  Anesthesia Plan: General   Post-op Pain Management:    Induction: Intravenous  PONV Risk Score and Plan: Propofol infusion and TIVA  Airway Management Planned: Natural Airway and Nasal Cannula  Additional Equipment:   Intra-op Plan:   Post-operative Plan:   Informed Consent: I have reviewed the patients History and Physical, chart, labs and discussed the procedure including the risks, benefits and alternatives for the proposed anesthesia with the patient or authorized representative who has indicated his/her understanding and acceptance.     Dental Advisory Given  Plan Discussed with: Anesthesiologist, CRNA and Surgeon  Anesthesia  Plan Comments: (Patient consented for risks of anesthesia including but not limited to:  - adverse reactions to medications - risk of airway placement if required - damage to eyes, teeth, lips or other oral mucosa - nerve damage due to positioning  - sore throat or hoarseness - Damage to heart, brain, nerves, lungs, other parts of body or loss of life  Patient voiced understanding.)        Anesthesia Quick Evaluation

## 2021-09-02 NOTE — Transfer of Care (Signed)
Immediate Anesthesia Transfer of Care Note  Patient: Dakota Dunn  Procedure(s) Performed: COLONOSCOPY WITH PROPOFOL  Patient Location: Endoscopy Unit  Anesthesia Type:General  Level of Consciousness: awake and drowsy  Airway & Oxygen Therapy: Patient Spontanous Breathing  Post-op Assessment: Report given to RN and Post -op Vital signs reviewed and stable  Post vital signs: Reviewed  Last Vitals:  Vitals Value Taken Time  BP 125/72 09/02/21 1046  Temp    Pulse 120 09/02/21 1047  Resp 15 09/02/21 1047  SpO2 93 % 09/02/21 1047  Vitals shown include unvalidated device data.  Last Pain:  Vitals:   09/02/21 1043  TempSrc: Temporal  PainSc: Asleep         Complications: No notable events documented.

## 2021-09-02 NOTE — H&P (Signed)
Dakota Dunn [id number] [birth year]     HPI:  Healthy 70 y.o male with a positive Cologuard test. For colonoscopy.   Medications Prior to Admission  Medication Sig Dispense Refill Last Dose   irbesartan (AVAPRO) 150 MG tablet Take 150 mg by mouth daily.   09/01/2021   atorvastatin (LIPITOR) 80 MG tablet Take 1 tablet (80 mg total) by mouth daily. 30 tablet 0    No Known Allergies Past Medical History:  Diagnosis Date   Hypertension    History reviewed. No pertinent surgical history. Social History   Socioeconomic History   Marital status: Married    Spouse name: Nicanor Mendolia   Number of children: 3   Years of education: 12   Highest education level: 12th grade  Occupational History   Occupation: retired  Tobacco Use   Smoking status: Every Day    Packs/day: 1.00    Years: 15.00    Pack years: 15.00    Types: Cigarettes   Smokeless tobacco: Never  Vaping Use   Vaping Use: Unknown  Substance and Sexual Activity   Alcohol use: Not Currently   Drug use: Never   Sexual activity: Yes  Other Topics Concern   Not on file  Social History Narrative   Not on file   Social Determinants of Health   Financial Resource Strain: Not on file  Food Insecurity: Not on file  Transportation Needs: Not on file  Physical Activity: Not on file  Stress: Not on file  Social Connections: Not on file  Intimate Partner Violence: Not on file   Social History   Social History Narrative   Not on file     ROS: Negative.     PE: HEENT: Negative. Lungs: Clear. Cardio: RR.   Assessment/Plan:  Proceed with planned endoscopy.   Forest Gleason Stefania Goulart 09/02/2021

## 2021-09-02 NOTE — Op Note (Signed)
[facility] Gastroenterology Patient Name: Dakota Dunn Procedure Date: 09/02/2021 9:52 AM MRN: [id number] Account #: [id number] Date of Birth: 10-Mar-1951 Admit Type: Outpatient Age: 70 Room: [facility] ENDO ROOM 1 Gender: Male Note Status: Finalized Instrument Name: Peds Colonoscope [id number] Procedure:             Colonoscopy Indications:           Positive Cologuard test Providers:             Robert Bellow, MD Medicines:             Propofol per Anesthesia Complications:         No immediate complications. Procedure:             Pre-Anesthesia Assessment:                        - Prior to the procedure, a History and Physical was                         performed, and patient medications, allergies and                         sensitivities were reviewed. The patient's tolerance                         of previous anesthesia was reviewed.                        - The risks and benefits of the procedure and the                         sedation options and risks were discussed with the                         patient. All questions were answered and informed                         consent was obtained.                        After obtaining informed consent, the colonoscope was                         passed under direct vision. Throughout the procedure,                         the patient's blood pressure, pulse, and oxygen                         saturations were monitored continuously. The                         Colonoscope was introduced through the anus and                         advanced to the the cecum, identified by appendiceal                         orifice and ileocecal valve. The colonoscopy was  performed without difficulty. The patient tolerated                         the procedure well. The quality of the bowel                         preparation was excellent. Findings:      A 10 mm polyp was found in the cecum. The  polyp was sessile. The polyp       was removed with a hot snare. Resection and retrieval were complete.      Two sessile polyps were found in the transverse colon, mid transverse       colon and distal transverse colon. The polyps were 5 mm in size. These       polyps were removed with a cold biopsy forceps. Resection and retrieval       were complete.      Two sessile polyps were found in the transverse colon, mid transverse       colon and distal transverse colon. The polyps were 8 mm in size. These       polyps were removed with a hot snare. Resection and retrieval were       complete.      A 8 mm polyp was found in the descending colon. The polyp was sessile.       The polyp was removed with a hot snare. Resection and retrieval were       complete.      A 5 mm polyp was found in the descending colon. The polyp was sessile.       Biopsies were taken with a cold forceps for histology.      The perianal exam findings include internal hemorrhoids that prolapse       with straining, but spontaneously regress to the resting position (Grade       II).      The retroflexed view of the distal rectum and anal verge was normal and       showed no anal or rectal abnormalities. Impression:            - One 10 mm polyp in the cecum, removed with a hot                         snare. Resected and retrieved.                        - Two 5 mm polyps in the transverse colon, in the mid                         transverse colon and in the distal transverse colon,                         removed with a cold biopsy forceps. Resected and                         retrieved.                        - Two 8 mm polyps in the transverse colon, in the mid  transverse colon and in the distal transverse colon,                         removed with a hot snare. Resected and retrieved.                        - One 8 mm polyp in the descending colon, removed with                         a hot  snare. Resected and retrieved.                        - One 5 mm polyp in the descending colon. Biopsied.                        - Internal hemorrhoids that prolapse with straining,                         but spontaneously regress to the resting position                         (Grade II) found on perianal exam.                        - The distal rectum and anal verge are normal on                         retroflexion view. Recommendation:        - Telephone endoscopist for pathology results in 1                         week. Procedure Code(s):     --- Professional ---                        [phone], Colonoscopy, flexible; with removal of                         tumor(s), polyp(s), or other lesion(s) by snare                         technique                        45380, 59, Colonoscopy, flexible; with biopsy, single                         or multiple Diagnosis Code(s):     --- Professional ---                        K63.5, Polyp of colon                        K64.1, Second degree hemorrhoids                        R19.5, Other fecal abnormalities CPT copyright 2019 American Medical Association. All rights reserved. The codes documented in this report are preliminary and upon coder review may  be revised to meet current compliance requirements. Dellis Filbert  Amedeo Kinsman, MD 09/02/2021 10:45:02 AM This report has been signed electronically. Number of Addenda: 0 Note Initiated On: 09/02/2021 9:52 AM Scope Withdrawal Time: 0 hours 28 minutes 41 seconds  Total Procedure Duration: 0 hours 35 minutes 31 seconds  Estimated Blood Loss:  Estimated blood loss was minimal.      [facility]

## 2021-09-02 NOTE — Anesthesia Postprocedure Evaluation (Signed)
Anesthesia Post Note  Patient: Dakota Dunn  Procedure(s) Performed: COLONOSCOPY WITH PROPOFOL  Patient location during evaluation: Endoscopy Anesthesia Type: General Level of consciousness: awake and alert Pain management: pain level controlled Vital Signs Assessment: post-procedure vital signs reviewed and stable Respiratory status: spontaneous breathing, nonlabored ventilation, respiratory function stable and patient connected to nasal cannula oxygen Cardiovascular status: blood pressure returned to baseline and stable Postop Assessment: no apparent nausea or vomiting Anesthetic complications: no   No notable events documented.   Last Vitals:  Vitals:   09/02/21 1043 09/02/21 1103  BP: 125/72 (!) 139/103  Pulse: (!) 119   Resp: 16   Temp: (!) 36.3 C   SpO2: 93%     Last Pain:  Vitals:   09/02/21 1103  TempSrc:   PainSc: 0-No pain                 Precious Haws [place] [place]

## 2021-09-03 ENCOUNTER — Encounter: Payer: Self-pay | Admitting: General Surgery

## 2021-09-03 LAB — SURGICAL PATHOLOGY

## 2022-03-01 IMAGING — MR MR HEAD W/O CM
10 series · 48 of 48 positions shown · non-contrast
Comparison: Head CT yesterday.

CLINICAL DATA: Altered mental status. Worsening weakness and speech
disturbance.

EXAM:
MRI HEAD WITHOUT CONTRAST
TECHNIQUE: Multiplanar, multiecho pulse sequences of the brain and surrounding
structures were obtained without intravenous contrast.

[Series 5: ax dwi_tracew · axial · 3.0mm · 1.31mm/px · z∈[-114,+40]mm · 8 of 48 slices shown]
[im 1/48]
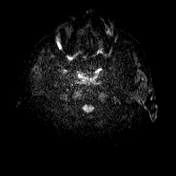
[im 7/48]
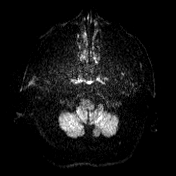
[im 14/48]
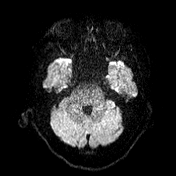
[im 21/48]
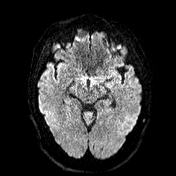
[im 27/48]
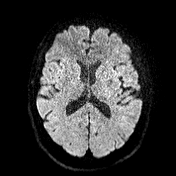
[im 34/48]
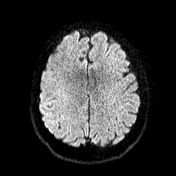
[im 41/48]
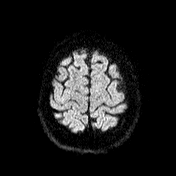
[im 48/48]
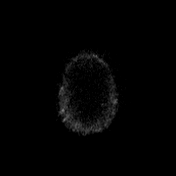

[Series 6: ax dwi_adc · axial · 3.0mm · 1.31mm/px · z∈[-114,+40]mm · 7 of 48 slices shown]
[im 1/48]
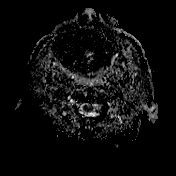
[im 8/48]
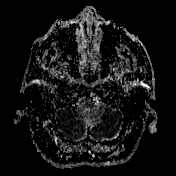
[im 16/48]
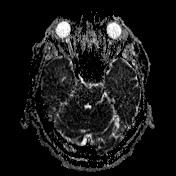
[im 24/48]
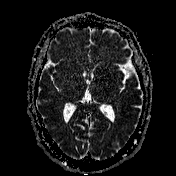
[im 32/48]
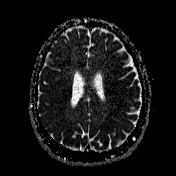
[im 40/48]
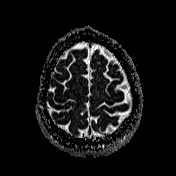
[im 48/48]
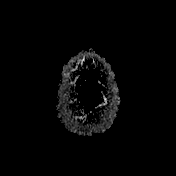

[Series 7: FLAIR · axial · 5.0mm · 1.20mm/px · z∈[-114,+40]mm · 4 of 27 slices shown]
[im 1/27]
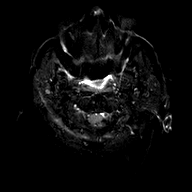
[im 9/27]
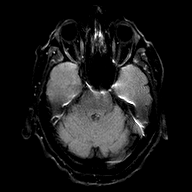
[im 18/27]
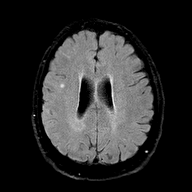
[im 27/27]
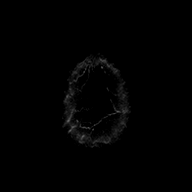

[Series 8: T2 · axial · 5.0mm · 0.45mm/px · z∈[-112,+42]mm · 4 of 27 slices shown (1 of 2)]
[im 1/27]
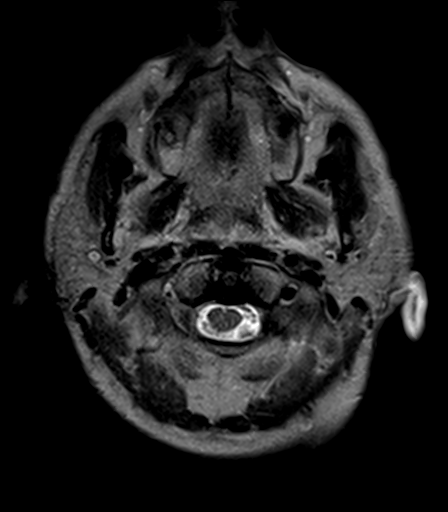
[im 9/27]
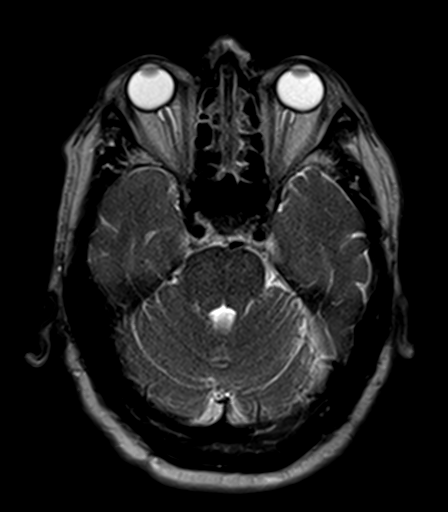
[im 18/27]
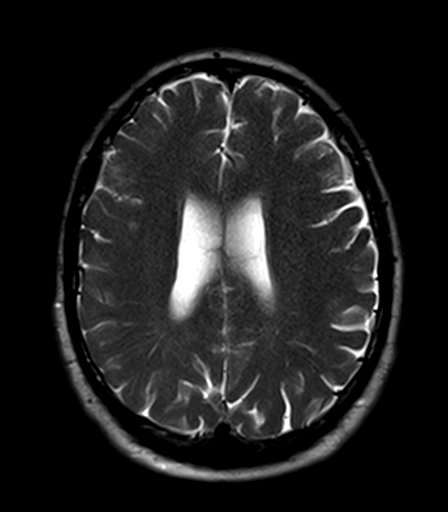
[im 27/27]
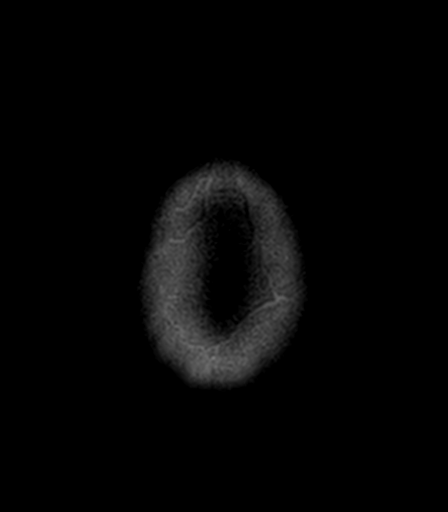

[Series 9: cor dwi_tracew · coronal · 5.0mm · 1.31mm/px · 5 of 38 slices shown]
[im 1/38]
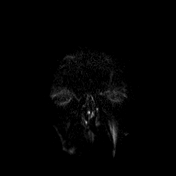
[im 10/38]
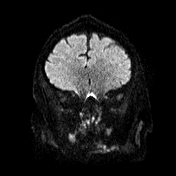
[im 19/38]
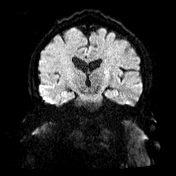
[im 28/38]
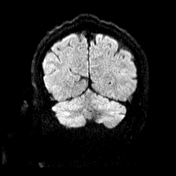
[im 38/38]
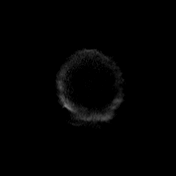

[Series 10: cor dwi_adc · coronal · 5.0mm · 1.31mm/px · 5 of 38 slices shown]
[im 1/38]
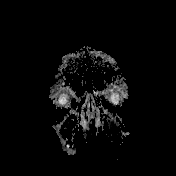
[im 10/38]
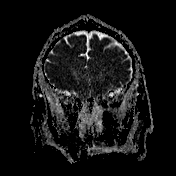
[im 19/38]
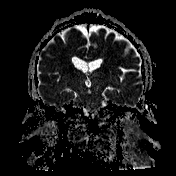
[im 28/38]
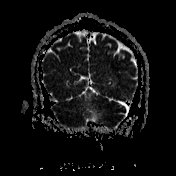
[im 38/38]
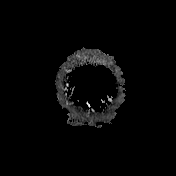

[Series 11: T2-star · axial · 5.0mm · 0.45mm/px · z∈[-112,+42]mm · 4 of 27 slices shown]
[im 1/27]
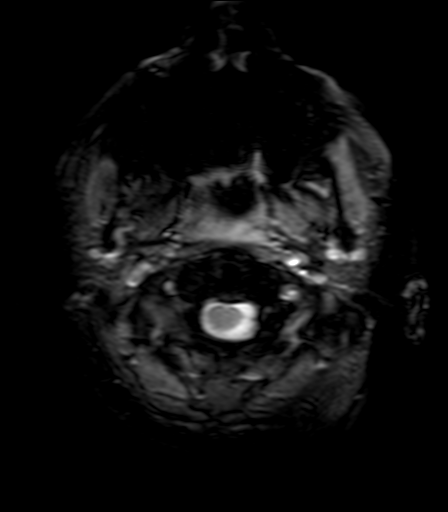
[im 9/27]
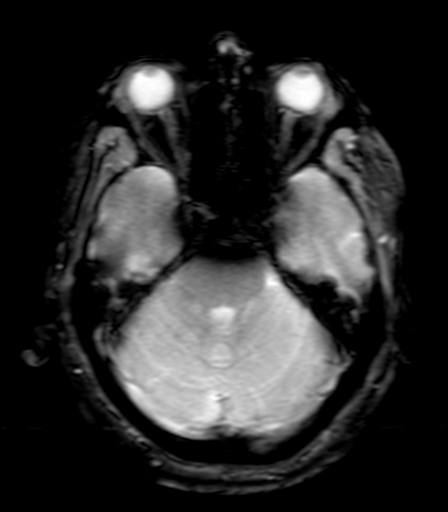
[im 18/27]
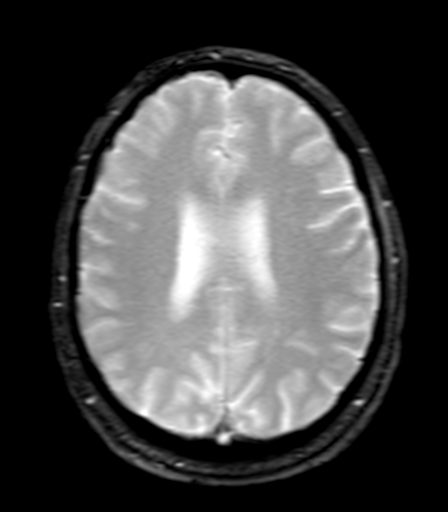
[im 27/27]
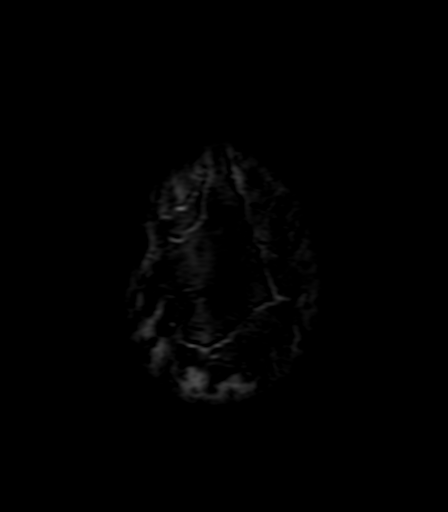

[Series 12: T1 · axial · 5.0mm · 0.90mm/px · z∈[-112,+42]mm · 4 of 27 slices shown (1 of 2)]
[im 1/27]
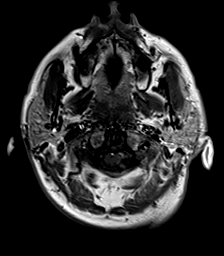
[im 9/27]
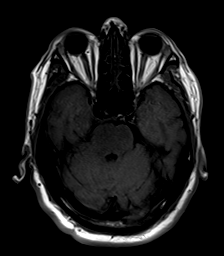
[im 18/27]
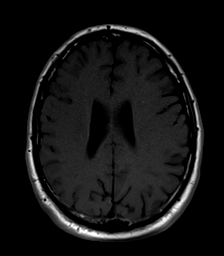
[im 27/27]
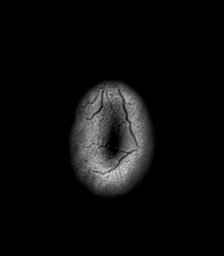

[Series 13: T1 · sagittal · 5.0mm · 0.94mm/px · 3 of 23 slices shown (2 of 2)]
[im 1/23]
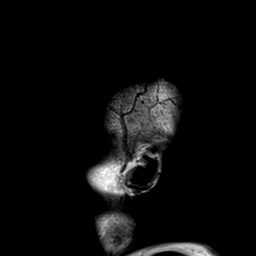
[im 12/23]
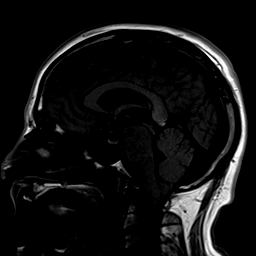
[im 23/23]
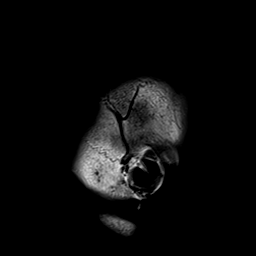

[Series 14: T2 · coronal · 5.0mm · 0.45mm/px · 4 of 31 slices shown (2 of 2)]
[im 1/31]
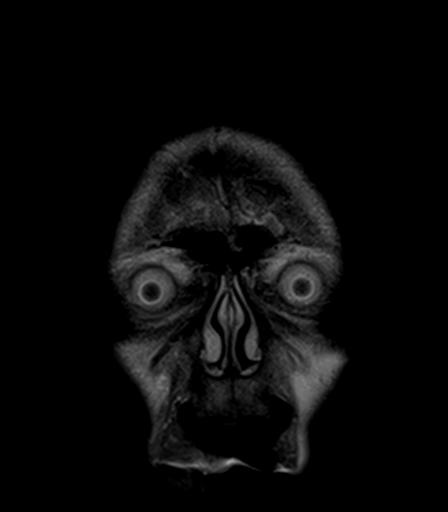
[im 11/31]
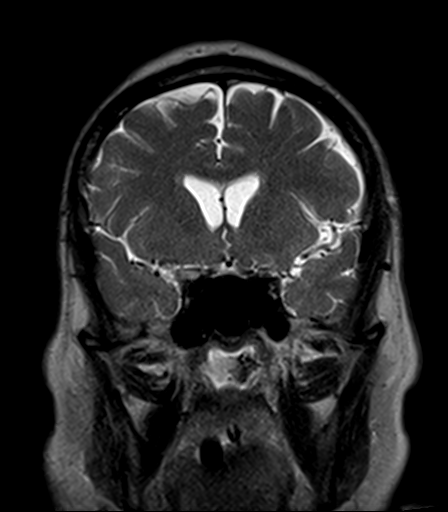
[im 21/31]
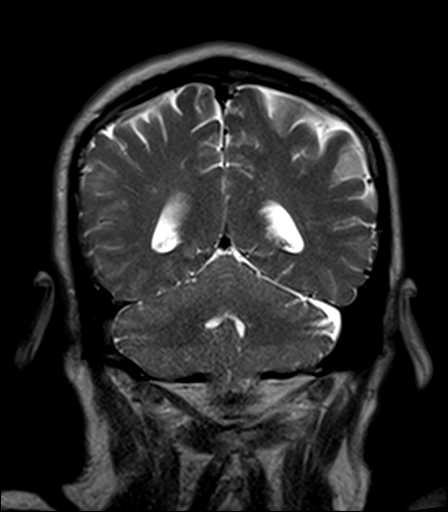
[im 31/31]
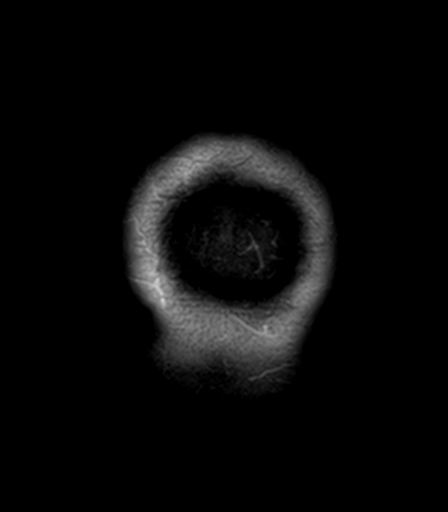

[48 of 48 positions shown; findings below may reference images not displayed]

FINDINGS: Brain: Diffusion imaging does not show any acute or subacute
infarction. Brainstem and cerebellum are normal. Cerebral
hemispheres show mild small vessel change the white matter. No
cortical or large vessel territory infarction. No mass lesion,
hemorrhage, hydrocephalus or extra-axial collection. There is
cerebellar tonsillar extension through the foramen magnum of 1.3 Cm
consistent with chronic Chiari malformation. No sign of upper
cervical cord syrinx.

Vascular: Major vessels at the base of the brain show flow.

Skull and upper cervical spine: Negative

Sinuses/Orbits: Clear/normal

Other: None
IMPRESSION: No acute finding by MRI. Chiari malformation with cerebellar
tonsillar extension through the foramen magnum of 1.3 cm.

Mild chronic small-vessel change of the cerebral hemispheric white
matter, often seen at this age.

## 2024-04-09 DIAGNOSIS — M6530 Trigger finger, unspecified finger: Secondary | ICD-10-CM | POA: Insufficient documentation

## 2024-04-10 ENCOUNTER — Ambulatory Visit (INDEPENDENT_AMBULATORY_CARE_PROVIDER_SITE_OTHER)

## 2024-04-10 VITALS — BP 122/68 | HR 79 | Ht 69.0 in | Wt 215.0 lb

## 2024-04-10 DIAGNOSIS — I10 Essential (primary) hypertension: Secondary | ICD-10-CM | POA: Diagnosis not present

## 2024-04-10 DIAGNOSIS — I5030 Unspecified diastolic (congestive) heart failure: Secondary | ICD-10-CM

## 2024-04-10 DIAGNOSIS — E782 Mixed hyperlipidemia: Secondary | ICD-10-CM | POA: Diagnosis not present

## 2024-04-10 DIAGNOSIS — E785 Hyperlipidemia, unspecified: Secondary | ICD-10-CM | POA: Insufficient documentation

## 2024-04-10 DIAGNOSIS — Z87891 Personal history of nicotine dependence: Secondary | ICD-10-CM | POA: Insufficient documentation

## 2024-04-10 DIAGNOSIS — I639 Cerebral infarction, unspecified: Secondary | ICD-10-CM

## 2024-04-10 NOTE — Assessment & Plan Note (Signed)
 Continue with atorvastatin  80 mg daily.  Will check lipid panel with next labs.

## 2024-04-10 NOTE — Assessment & Plan Note (Signed)
 Denies cough shortness of breath.  We may consider pulmonary function testing down the road given his possible hospitalization for COPD exacerbation.  Discussed with patient to follow-up early in case of upper respiratory tract infections.  He has more than 30 years smoking history.

## 2024-04-10 NOTE — Progress Notes (Signed)
 New Patient Visit   Patient: Dakota Dunn   DOB: December 14, 1950   73 y.o. Male  MRN: [id number] Visit Date: 04/10/2024  Today's healthcare provider: Parris DELENA Juneau, MD   Chief Complaint  Patient presents with   Establish Care   Subjective  Dakota Dunn is a 73 y.o. male who presents today as a new patient to establish care.   HPI   Patient seen to establish care.  He did have a hospitalization inThis patient 2021 with an intubation.  Diagnosis from notes indicates acute vs. subacute cortical infarct, elevated troponin's, and acute hypercapnic respiratory failure secondary to vascular congestion and suspected undiagnosed COPD exacerbation requiring mechanical intubation.  It is not clear that this is what we have.  Patient denies any cough or shortness of breath.  He is physically active without any limitations to activity.  CT of his head revealed a small focal cortical hypodensity in the right frontal lobe which may or may not reflect acute or subacute cortical infarct this was the finding on the CT scan initially.  MRI report was negative.  Patient had an echo done in 2021 just after initial hospitalization which showed normal EF of 50 to 55% without regional wall abnormalities.  There was no evidence of systolic or diastolic dysfunction on findings.  Patient has a history of hyperlipidemia for which she takes Lipitor 80 mg daily.  He has a history of hypertension for which he takes irbesartan 150 mg tablets daily.  His blood pressure today is 122/68 and is well-controlled at home.  Patient denies any chest pain or palpitations.  He has no dizziness or lightheadedness.   Patient has overall the diet.  No issues with sleep.  He does engage in significant physical activity on most days.  Patient does receive vaccines and will be due for pneumonia vaccine shortly.  Patient had a positive Cologuard in 2022 and did receive a colonoscopy at that time   Assessment & Plan     #1 hypertension.  Stable continue with irbesartan at current dose.  2.  Hyperlipidemia.  Patient to continue with atorvastatin  80 mg daily.  He is at higher cardiovascular risk given smoking history.  3.  There is a mention of diastolic congestive heart failure on previous hospitalization but there is no image of the heart on echo results.  Patient denies any shortness of breath or other symptoms that would indicate COPD although there is some thought that his hospitalization was due to COPD exacerbation.  He should probably have a repeat pulmonary function testing we will discuss with this in the future.  He does have some coarse lung sounds at the base of lungs and so we will obtain baseline chest x-ray.  He should have pneumonia vaccine   Patient should follow up early in the case of upper respiratory tract infection.  Will obtain basic labs and have him follow-up in September.  May need updated pneumonia vaccine.   Objective    BP 122/68   Pulse 79   Ht 5' 9 (1.753 m)   Wt 215 lb (97.5 kg)   SpO2 93%   BMI 31.75 kg/m      Review of Systems  Constitutional:  Negative for chills, fever and weight loss.  Eyes:  Negative for blurred vision.  Respiratory:  Negative for cough and shortness of breath.   Cardiovascular:  Negative for chest pain and palpitations.  Skin:  Negative for rash.  Psychiatric/Behavioral:  Negative for depression. The patient  is not nervous/anxious.      Physical Exam Physical Exam Vitals reviewed.  Constitutional:      Appearance: Normal appearance. Well-developed with normal weight.  HENT:     Head: Normocephalic and atraumatic.  Normal mucous membranes, no oral lesions Eyes:     Pupils: Pupils are equal, round, and reactive to light.  Neck:     Thyroid: No thyroid mass or thyromegaly.  Cardiovascular:     Rate and Rhythm: Normal rate and regular rhythm. Normal heart sounds. Normal peripheral pulses Pulmonary:     Normal breath sounds with  normal effort Abdominal:   Abdomen is soft, without tenderness or noted hepatosplenomegaly Musculoskeletal:        General: No swelling or edema  Lymphadenopathy:     Cervical: No cervical adenopathy.  Skin:    General: Skin is warm and dry without noticeable rash. Neurological:     General: No focal deficit present.  Psychiatric:        Mood and Affect: Mood, behavior and cognition normal   Past Medical History:  Diagnosis Date   Acquired trigger finger 04/09/2024   Hypertension    Past Surgical History:  Procedure Laterality Date   COLONOSCOPY WITH PROPOFOL  N/A 09/02/2021   Procedure: COLONOSCOPY WITH PROPOFOL ;  Surgeon: Dessa Reyes ORN, MD;  Location: [facility] ENDOSCOPY;  Service: Endoscopy;  Laterality: N/A;   Family Status  Relation Name Status   Mother  Deceased   Father  Deceased   Sister  Alive   Brother  Alive  No partnership data on file   History reviewed. No pertinent family history. Social History   Socioeconomic History   Marital status: Married    Spouse name: Titus Drone   Number of children: 3   Years of education: 12   Highest education level: 12th grade  Occupational History   Occupation: retired  Tobacco Use   Smoking status: Every Day    Current packs/day: 1.00    Average packs/day: 1 pack/day for 15.0 years (15.0 ttl pk-yrs)    Types: Cigarettes   Smokeless tobacco: Never  Vaping Use   Vaping status: Unknown  Substance and Sexual Activity   Alcohol use: Not Currently   Drug use: Never   Sexual activity: Yes  Other Topics Concern   Not on file  Social History Narrative   Not on file   Social Drivers of Health   Financial Resource Strain: Not on file  Food Insecurity: Not on file  Transportation Needs: Not on file  Physical Activity: Not on file  Stress: Not on file  Social Connections: Not on file   Outpatient Medications Prior to Visit  Medication Sig   atorvastatin  (LIPITOR) 80 MG tablet Take 1 tablet (80 mg total) by  mouth daily.   irbesartan (AVAPRO) 150 MG tablet Take 150 mg by mouth daily.   No facility-administered medications prior to visit.   No Known Allergies   There is no immunization history on file for this patient.  Health Maintenance  Topic Date Due   Medicare Annual Wellness (AWV)  Never done   Hepatitis C Screening  Never done   DTaP/Tdap/Td (1 - Tdap) Never done   Pneumococcal Vaccine: 50+ Years (1 of 2 - PCV) Never done   Zoster Vaccines- Shingrix (1 of 2) Never done   COVID-19 Vaccine (1 - 2024-25 season) Never done   INFLUENZA VACCINE  04/27/2024   Colonoscopy  09/03/2031   Hepatitis B Vaccines  Aged Out   HPV  VACCINES  Aged Out   Meningococcal B Vaccine  Aged Out    Patient Care Team: Corlis Honor BROCKS, MD as PCP - General (Internal Medicine)  Depression Screen     No data to display           Parris DELENA Juneau, MD  [facility] [phone] (phone) [phone] (fax)  [facility]

## 2024-04-10 NOTE — Assessment & Plan Note (Signed)
 Not confirmed on imaging.  Patient should remain on Lipitor in any case.

## 2024-04-10 NOTE — Assessment & Plan Note (Signed)
 Continue with irbesartan 150 mg daily.  Stable overall.

## 2024-06-11 ENCOUNTER — Other Ambulatory Visit: Payer: Self-pay

## 2024-06-11 DIAGNOSIS — E782 Mixed hyperlipidemia: Secondary | ICD-10-CM

## 2024-06-11 DIAGNOSIS — I5030 Unspecified diastolic (congestive) heart failure: Secondary | ICD-10-CM

## 2024-06-11 DIAGNOSIS — I639 Cerebral infarction, unspecified: Secondary | ICD-10-CM

## 2024-06-11 DIAGNOSIS — I10 Essential (primary) hypertension: Secondary | ICD-10-CM

## 2024-06-12 ENCOUNTER — Other Ambulatory Visit

## 2024-06-12 DIAGNOSIS — I639 Cerebral infarction, unspecified: Secondary | ICD-10-CM | POA: Diagnosis not present

## 2024-06-12 DIAGNOSIS — I5030 Unspecified diastolic (congestive) heart failure: Secondary | ICD-10-CM | POA: Diagnosis not present

## 2024-06-12 DIAGNOSIS — E782 Mixed hyperlipidemia: Secondary | ICD-10-CM | POA: Diagnosis not present

## 2024-06-12 DIAGNOSIS — R7309 Other abnormal glucose: Secondary | ICD-10-CM | POA: Diagnosis not present

## 2024-06-13 ENCOUNTER — Other Ambulatory Visit: Payer: Self-pay

## 2024-06-13 ENCOUNTER — Ambulatory Visit: Payer: Self-pay

## 2024-06-13 DIAGNOSIS — R972 Elevated prostate specific antigen [PSA]: Secondary | ICD-10-CM

## 2024-06-14 LAB — CBC WITH DIFFERENTIAL/PLATELET
Absolute Lymphocytes: 3321 {cells}/uL (ref 850–3900)
Absolute Monocytes: 828 {cells}/uL (ref 200–950)
Basophils Absolute: 64 {cells}/uL (ref 0–200)
Basophils Relative: 0.7 %
Eosinophils Absolute: 276 {cells}/uL (ref 15–500)
Eosinophils Relative: 3 %
HCT: 48.4 % (ref 38.5–50.0)
Hemoglobin: 15 g/dL (ref 13.2–17.1)
MCH: 29.6 pg (ref 27.0–33.0)
MCHC: 31 g/dL — ABNORMAL LOW (ref 32.0–36.0)
MCV: 95.5 fL (ref 80.0–100.0)
MPV: 9.2 fL (ref 7.5–12.5)
Monocytes Relative: 9 %
Neutro Abs: 4710 {cells}/uL (ref 1500–7800)
Neutrophils Relative %: 51.2 %
Platelets: 347 Thousand/uL (ref 140–400)
RBC: 5.07 Million/uL (ref 4.20–5.80)
RDW: 13.5 % (ref 11.0–15.0)
Total Lymphocyte: 36.1 %
WBC: 9.2 Thousand/uL (ref 3.8–10.8)

## 2024-06-14 LAB — URINALYSIS, ROUTINE W REFLEX MICROSCOPIC
Bilirubin Urine: NEGATIVE
Glucose, UA: NEGATIVE
Hgb urine dipstick: NEGATIVE
Ketones, ur: NEGATIVE
Leukocytes,Ua: NEGATIVE
Nitrite: NEGATIVE
Protein, ur: NEGATIVE
Specific Gravity, Urine: 1.01 (ref 1.001–1.035)
pH: 6.5 (ref 5.0–8.0)

## 2024-06-14 LAB — COMPREHENSIVE METABOLIC PANEL WITH GFR
AG Ratio: 1.7 (calc) (ref 1.0–2.5)
ALT: 15 U/L (ref 9–46)
AST: 19 U/L (ref 10–35)
Albumin: 4.3 g/dL (ref 3.6–5.1)
Alkaline phosphatase (APISO): 90 U/L (ref 35–144)
BUN: 13 mg/dL (ref 7–25)
CO2: 32 mmol/L (ref 20–32)
Calcium: 9.4 mg/dL (ref 8.6–10.3)
Chloride: 103 mmol/L (ref 98–110)
Creat: 0.85 mg/dL (ref 0.70–1.28)
Globulin: 2.5 g/dL (ref 1.9–3.7)
Glucose, Bld: 84 mg/dL (ref 65–99)
Potassium: 5.5 mmol/L — ABNORMAL HIGH (ref 3.5–5.3)
Sodium: 142 mmol/L (ref 135–146)
Total Bilirubin: 0.5 mg/dL (ref 0.2–1.2)
Total Protein: 6.8 g/dL (ref 6.1–8.1)
eGFR: 92 mL/min/1.73m2 (ref >=60)

## 2024-06-14 LAB — LIPOPROTEIN A (LPA): Lipoprotein (a): 12 nmol/L (ref <75)

## 2024-06-14 LAB — LIPID PANEL
Cholesterol: 134 mg/dL (ref <200)
HDL: 65 mg/dL (ref >=40)
LDL Cholesterol (Calc): 50 mg/dL
Non-HDL Cholesterol (Calc): 69 mg/dL (ref <130)
Total CHOL/HDL Ratio: 2.1 (calc) (ref <5.0)
Triglycerides: 102 mg/dL (ref <150)

## 2024-06-14 LAB — IRON,TIBC AND FERRITIN PANEL
%SAT: 34 % (ref 20–48)
Ferritin: 124 ng/mL (ref 24–380)
Iron: 106 ug/dL (ref 50–180)
TIBC: 309 ug/dL (ref 250–425)

## 2024-06-14 LAB — TSH+FREE T4: TSH W/REFLEX TO FT4: 4.17 m[IU]/L (ref 0.40–4.50)

## 2024-06-14 LAB — HEMOGLOBIN A1C
Hgb A1c MFr Bld: 5.8 % — ABNORMAL HIGH (ref <5.7)
Mean Plasma Glucose: 120 mg/dL
eAG (mmol/L): 6.6 mmol/L

## 2024-06-14 LAB — PSA: PSA: 12.94 ng/mL — ABNORMAL HIGH (ref <=4.00)

## 2024-06-19 ENCOUNTER — Ambulatory Visit (INDEPENDENT_AMBULATORY_CARE_PROVIDER_SITE_OTHER)

## 2024-06-19 VITALS — BP 117/56 | HR 65 | Ht 69.0 in | Wt 217.8 lb

## 2024-06-19 DIAGNOSIS — Z23 Encounter for immunization: Secondary | ICD-10-CM

## 2024-06-19 DIAGNOSIS — R972 Elevated prostate specific antigen [PSA]: Secondary | ICD-10-CM | POA: Insufficient documentation

## 2024-06-19 NOTE — Progress Notes (Signed)
 Progress Note  Physician: Shalom Mcguiness A Detrell Umscheid, MD   HPI: Dakota Dunn is a 73 y.o. male presenting on 06/19/2024 for Follow-up (Concerned about cholesterol ) .  Discussed the use of AI scribe software for clinical note transcription with the patient, who gave verbal consent to proceed.  History of Present Illness   Dakota Dunn is a 73 year old male with benign prostatic hyperplasia who presents for follow-up of elevated PSA levels.  Prostatic disease and elevated psa - History of elevated PSA levels since the 1970s with fluctuations over time per patient report - Current PSA level is 12.9 - History of benign prostatic hyperplasia with no urinary symptoms - No changes in urination, including frequency, urgency, or nocturia  - Has been referred to urology for fu  Cardiovascular and neurological symptoms - In 2021, possible stroke or myocardial infarction with CT scan showing hypoattenuating lesion/acute or subacute stroke - No current symptoms of dizziness, lightheadedness, or chest pain.   - Tolerating atorvastatin  80 mg  - Quit smoking in 2021 after a hospital stay - Maintains a physically active lifestyle, walking over two miles daily - No shortness of breath, cough, or respiratory issues - No use of inhalers - Good energy levels     Hypertension  - Well controlled, 117/56.  Continues with irbesartan 150 mg tabs    Medical history:  Relevant past medical, surgical, family and social history reviewed and updated as indicated. Interim medical history since our last visit reviewed.  Allergies and medications reviewed and updated.   ROS: Negative unless specifically indicated above in HPI.    Current Outpatient Medications:    atorvastatin  (LIPITOR) 80 MG tablet, Take 1 tablet (80 mg total) by mouth daily., Disp: 30 tablet, Rfl: 0   irbesartan (AVAPRO) 150 MG tablet, Take 150 mg by mouth daily., Disp: , Rfl:        Objective:     BP (!) 117/56    Pulse 65   Ht 5' 9 (1.753 m)   Wt 217 lb 12.8 oz (98.8 kg)   SpO2 95%   BMI 32.16 kg/m   Wt Readings from Last 3 Encounters:  06/19/24 217 lb 12.8 oz (98.8 kg)  04/10/24 215 lb (97.5 kg)  09/02/21 223 lb (101.2 kg)    Physical Exam  Physical Exam Vitals reviewed.  Constitutional:      Appearance: Normal appearance. Well-developed with normal weight.  Cardiovascular:     Rate and Rhythm: Normal rate and regular rhythm. Normal heart sounds. Normal peripheral pulses Pulmonary:     Normal breath sounds with normal effort Skin:    General: Skin is warm and dry without noticeable rash. Neurological:     General: No focal deficit present.  Psychiatric:        Mood and Affect: Mood, behavior and cognition normal      Assessment & Plan:   Encounter Diagnoses  Name Primary?   PSA elevation Yes    No orders of the defined types were placed in this encounter.    Assessment and Plan    Benign prostatic hyperplasia with elevated PSA Chronic benign prostatic hyperplasia with elevated PSA likely due to BPH. - Refer to urologist for further evaluation. Baseline PSA is unknown and interval increase not clear.  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 No current neurological symptoms. Emphasized continuing rosuvastatin for  secondary prevention. - Continue rosuvastatin therapy.  - Should be taking daily ASA  - Continue to monitor BP at home  Prediabetes Hemoglobin A1c at 5.8 indicates prediabetes. Discussed monitoring glucose and lifestyle changes to prevent diabetes progression. - Continue monitoring blood glucose levels. - Encourage lifestyle modifications including regular physical activity and a healthy diet.  General Health Maintenance Discussed importance of vaccinations. Up to date on pneumonia vaccines. Discussed annual flu vaccine benefits.  - Administer high-dose flu vaccine  today.     Fu in 6 months with PE as needed

## 2024-06-20 DIAGNOSIS — Z23 Encounter for immunization: Secondary | ICD-10-CM | POA: Diagnosis not present

## 2024-06-20 NOTE — Addendum Note (Signed)
 Addended by: ZELIA GAUZE D on: 06/20/2024 10:03 AM   Modules accepted: Orders

## 2024-06-25 ENCOUNTER — Other Ambulatory Visit: Payer: Self-pay

## 2024-06-25 MED ORDER — ATORVASTATIN CALCIUM 80 MG PO TABS: 80.0000 mg | ORAL_TABLET | Freq: Every day | ORAL | Status: DC

## 2024-06-25 NOTE — Telephone Encounter (Signed)
 Copied from CRM #[id number]. Topic: Clinical - Medication Refill >> Jun 25, 2024 11:04 AM Berwyn MATSU wrote: Medication: atorvastatin  (LIPITOR) 20 MG tablet  Has the patient contacted their pharmacy? Yes (Agent: If no, request that the patient contact the pharmacy for the refill. If patient does not wish to contact the pharmacy document the reason why and proceed with request.) (Agent: If yes, when and what did the pharmacy advise?)  This is the patient's preferred pharmacy:  [id number] [place], [place] - [address] AT [facility] OF [address] [employer] [address] [place] [address] Phone: [phone] Fax: [phone]  Is this the correct pharmacy for this prescription? Yes If no, delete pharmacy and type the correct one.   Has the prescription been filled recently? Yes  Is the patient out of the medication? Yes  Has the patient been seen for an appointment in the last year OR does the patient have an upcoming appointment? Yes  Can we respond through MyChart? No  Agent: Please be advised that Rx refills may take up to 3 business days. We ask that you follow-up with your pharmacy.

## 2024-06-29 ENCOUNTER — Telehealth: Payer: Self-pay

## 2024-06-29 MED ORDER — ATORVASTATIN CALCIUM 80 MG PO TABS: 80.0000 mg | ORAL_TABLET | Freq: Every day | ORAL | 1 refills | Status: DC

## 2024-06-29 NOTE — Telephone Encounter (Signed)
 Refill sent to pharmacy.

## 2024-06-29 NOTE — Telephone Encounter (Signed)
 Prescription Request  06/29/2024  LOV: 06/19/2024  What is the name of the medication or equipment? ATONVASTATIN  Have you contacted your pharmacy to request a refill? Yes   Which pharmacy would you like this sent to?  [facility] #[id number] [place], [place] - [address] AT [facility] OF [address] [place] [address] [place] [address] Phone: [phone] Fax: [phone]    Patient notified that their request is being sent to the clinical staff for review and that they should receive a response within 2 business days.   Please advise at Mobile [phone] (mobile)

## 2024-06-29 NOTE — Addendum Note (Signed)
 Addended by: ZELIA GAUZE D on: 06/29/2024 11:06 AM   Modules accepted: Orders

## 2024-07-02 ENCOUNTER — Telehealth: Payer: Self-pay

## 2024-07-02 ENCOUNTER — Other Ambulatory Visit: Payer: Self-pay

## 2024-07-02 MED ORDER — ATORVASTATIN CALCIUM 20 MG PO TABS: 20.0000 mg | ORAL_TABLET | Freq: Every day | ORAL | 3 refills | Status: AC

## 2024-07-02 NOTE — Telephone Encounter (Signed)
 Refill request

## 2024-07-16 NOTE — Progress Notes (Unsigned)
 07/20/24 9:14 AM   Dakota Dunn 31-Aug-1951 [id number]   HPI: 73 y.o. male here for initial evaluation of elevated PSA Accompanied by wife today Reportedly chronic elevated PSA, fluctuating data over 30-40 years He has never seen a urologist, denies prior workup for elevated PSA  PSA 12.94 (Sept 2024)  PSA 12.2 (2022)  No acute change in urinary habits, denies LUTS Denies personal history of GH, prostatitis, UTIs, nephrolithiasis No family history of prostate cancer Previously worked on sewing machines, now retired and mows a lot of grass    PMH: Past Medical History:  Diagnosis Date   Acquired trigger finger 04/09/2024   Hypertension     Surgical History: Past Surgical History:  Procedure Laterality Date   COLONOSCOPY WITH PROPOFOL  N/A 09/02/2021   Procedure: COLONOSCOPY WITH PROPOFOL ;  Surgeon: Dessa Reyes ORN, MD;  Location: [facility] ENDOSCOPY;  Service: Endoscopy;  Laterality: N/A;    Family History: No family history on file.  Social History:  reports that he has been smoking cigarettes. He has a 15 pack-year smoking history. He has never used smokeless tobacco. He reports that he does not currently use alcohol. He reports that he does not use drugs.      Physical Exam: BP (!) 136/57   Pulse 67   Ht 5' 9 (1.753 m)   Wt 211 lb (95.7 kg)   BMI 31.16 kg/m    Constitutional:  Alert and oriented, No acute distress. Cardiovascular: No clubbing, cyanosis, or edema. Respiratory: Normal respiratory effort, no increased work of breathing. GI: Nondistended GU: 50 g gland, diffusely nodular although not firm, suspect BPH, rectal blood Skin: No rashes, bruises or suspicious lesions. Neurologic: Grossly intact, no focal deficits, moving all 4 extremities. Psychiatric: Normal mood and affect.  Laboratory Data: Component Ref Range & Units (hover) 1 mo ago  PSA 12.94 High   PSA 12.4 (2022)   Pertinent Imaging: N/A    Assessment & Plan:    Elevated  PSA Assessment & Plan: PSA 12.94 (Sept 2025) Reports 'high' PSA over ~30 years, no prior data in our records DRE-50 g gland, diffusely nodular although soft  We discussed the significance of an elevated prostate-specific antigen (PSA) level. PSA is a nonspecific marker and may be elevated due to both benign and malignant causes. Benign factors include benign prostatic hyperplasia (BPH), prostatitis or urinary tract infection, recent ejaculation, catheterization or instrumentation, and advancing age. Malignant causes include prostate cancer of varying risk categories.  We reviewed that a single PSA value is less informative than following PSA trends over time, which can better reflect underlying pathology. Risk factors for prostate cancer include increasing age, family history of prostate cancer, African American race, and known germline mutations (e.g., BRCA2).  Next steps may include repeating PSA to confirm elevation, consideration of additional biomarkers or PSA derivatives (e.g., %free PSA, PSA density), and obtaining a multiparametric prostate MRI to assess for suspicious lesions. Based on PSA kinetics, risk profile, and MRI findings, a prostate biopsy may be recommended for definitive diagnosis.  - interval PSA today  - proceed with prostate MRI   -We reviewed possible outcomes: If negative MRI, will need to assess PSAD and shared decision making whether to proceed with 12 core systematic biopsy.  If positive MRI, we will proceed with targeted fusion biopsy  Orders: -     PSA -     MR PROSTATE W WO CONTRAST; Future      Penne Skye, MD 07/20/2024  [facility] [address]  [address] [place], [place] [address] [phone]

## 2024-07-16 NOTE — Assessment & Plan Note (Signed)
 PSA 12.94 (Sept 2025) Reports 'high' PSA over ~30 years, no prior data in our records DRE-50 g gland, diffusely nodular although soft  We discussed the significance of an elevated prostate-specific antigen (PSA) level. PSA is a nonspecific marker and may be elevated due to both benign and malignant causes. Benign factors include benign prostatic hyperplasia (BPH), prostatitis or urinary tract infection, recent ejaculation, catheterization or instrumentation, and advancing age. Malignant causes include prostate cancer of varying risk categories.  We reviewed that a single PSA value is less informative than following PSA trends over time, which can better reflect underlying pathology. Risk factors for prostate cancer include increasing age, family history of prostate cancer, African American race, and known germline mutations (e.g., BRCA2).  Next steps may include repeating PSA to confirm elevation, consideration of additional biomarkers or PSA derivatives (e.g., %free PSA, PSA density), and obtaining a multiparametric prostate MRI to assess for suspicious lesions. Based on PSA kinetics, risk profile, and MRI findings, a prostate biopsy may be recommended for definitive diagnosis.  - interval PSA today  - proceed with prostate MRI   -We reviewed possible outcomes: If negative MRI, will need to assess PSAD and shared decision making whether to proceed with 12 core systematic biopsy.  If positive MRI, we will proceed with targeted fusion biopsy

## 2024-07-20 ENCOUNTER — Ambulatory Visit: Admitting: Urology

## 2024-07-20 VITALS — BP 136/57 | HR 67 | Ht 69.0 in | Wt 211.0 lb

## 2024-07-20 DIAGNOSIS — R972 Elevated prostate specific antigen [PSA]: Secondary | ICD-10-CM | POA: Diagnosis not present

## 2024-07-20 NOTE — Patient Instructions (Signed)
 Please contact Central Scheduling to set up your prostate MRI at [phone].  Prostate MRI Prep:  1- No ejaculation 48 hours prior to exam  2- No caffeine or carbonated beverages on day of the exam  3- Eat light diet evening prior and day of exam  4- Avoid eating 4 hours prior to exam  5- Fleets enema needs to be done 4 hours prior to exam -See below. Can be purchased at the drug store.

## 2024-07-21 LAB — PSA: Prostate Specific Ag, Serum: 16.8 ng/mL — ABNORMAL HIGH (ref 0.0–4.0)

## 2024-07-26 ENCOUNTER — Ambulatory Visit
Admission: RE | Admit: 2024-07-26 | Discharge: 2024-07-26 | Disposition: A | Discharge status: Home / Self Care | Source: Ambulatory Visit | Attending: Urology | Admitting: Urology

## 2024-07-26 DIAGNOSIS — R972 Elevated prostate specific antigen [PSA]: Secondary | ICD-10-CM | POA: Diagnosis present

## 2024-07-26 MED ORDER — GADOBUTROL 1 MMOL/ML IV SOLN
10.0000 mL | Freq: Once | INTRAVENOUS | Status: AC | PRN
  Administered 2024-07-26: 10 mL via INTRAVENOUS

## 2024-07-27 ENCOUNTER — Ambulatory Visit: Payer: Self-pay | Admitting: Urology

## 2024-07-27 NOTE — Telephone Encounter (Signed)
 Called patient to inform him that Dr.Garren reviewed his Prostate MRI results and he had a suspicious lesion that Dr.Garren recommended a targeted Fusion Biopsy for further treatment. We scheduled his Fusion biopsy for December 3rd 2025 @1 :30pm and his 1 week follow up result appointment at 1:00pm on December 11. We discussed that he was unsure about the Fusion Biopsy but we could go head and schedule it and if he decided not to go through with it he could call our office to cancel his appointment if needed. Also reviewed the prostate Biopsy pre-instructions with pt.  Pt voiced understanding and had no further questions.-Coby Antrobus,CMA

## 2024-07-27 NOTE — Progress Notes (Signed)
 Reviewed his MRI this morning- which does show a suspicious lesion along the Right apex. Would recommend proceeding with a targeted fusion prostate biopsy. We had discussed this possibility at his initial visit.   Can we schedule him for next available fusion biopsy and provide instructions?  Thank you!

## 2024-08-09 ENCOUNTER — Encounter: Payer: Self-pay | Admitting: Urology

## 2024-08-22 NOTE — Progress Notes (Signed)
   08/22/24  Indication:  PSA 12.94 (Sept 2025) Reports 'high' PSA over ~30 years, no prior data in our records DRE-50 g gland, diffusely nodular although soft MRI - PIRADS 4 at Right lateral apical PZ, 115g gland   MRI Fusion Prostate Biopsy Procedure:   Informed consent was obtained, and we discussed the risks of bleeding and infection/sepsis. A time out was performed to ensure correct patient identity.  Pre-Procedure: - Last PSA Level:  Lab Results  Component Value Date   PSA 12.94 (H) 06/12/2024   - Gentamicin and levaquin given for antibiotic prophylaxis - MRI images were reviewed, targets mapped and uploaded via Dynacad   - Prostate volume by MRI: 115 cc  ROI 1: PIRADS 4 at Right lateral apical PZ  Procedure: - Periprostatic nerve block performed using 20 cc 1% lidocaine   - Anatomic measurements obtained revealing a 127 gm prostate, PSA density 0.10 - small hypoechoic lesion noted at Right apex- at ROI1  Next, the MRI images were registered to real-time ultrasound using UroNav fusion software with accurate contour alignment. We noted 1 target lesions. Three biopsy cores were obtained from each target lesion. Next, we obtained an additional 12-core systematic biopsy template.   Total biopsy cores: 12 systematic + 3 cores RO1 = 15  Post-Procedure: - Patient tolerated the procedure well - He was counseled to seek immediate medical attention if experiences significant bleeding, fevers, urinary retention, or severe pain - Return in one week to discuss biopsy results  Assessment/ Plan: Will follow up in ~7-10 days to discuss pathology  Penne Skye, MD 08/22/2024

## 2024-08-29 ENCOUNTER — Ambulatory Visit: Admitting: Urology

## 2024-08-29 DIAGNOSIS — C61 Malignant neoplasm of prostate: Secondary | ICD-10-CM | POA: Diagnosis not present

## 2024-08-29 DIAGNOSIS — R972 Elevated prostate specific antigen [PSA]: Secondary | ICD-10-CM | POA: Diagnosis not present

## 2024-08-29 DIAGNOSIS — N4232 Atypical small acinar proliferation of prostate: Secondary | ICD-10-CM | POA: Diagnosis not present

## 2024-08-29 DIAGNOSIS — Z2989 Encounter for other specified prophylactic measures: Secondary | ICD-10-CM | POA: Diagnosis not present

## 2024-08-29 MED ORDER — GENTAMICIN SULFATE 40 MG/ML IJ SOLN
80.0000 mg | Freq: Once | INTRAMUSCULAR | Status: AC
  Administered 2024-08-29: 80 mg via INTRAMUSCULAR

## 2024-08-29 MED ORDER — LIDOCAINE HCL URETHRAL/MUCOSAL 2 % EX GEL
1.0000 | Freq: Once | CUTANEOUS | Status: AC
  Administered 2024-08-29: 1 via URETHRAL

## 2024-08-29 MED ORDER — LEVOFLOXACIN 500 MG PO TABS
500.0000 mg | ORAL_TABLET | Freq: Once | ORAL | Status: AC
  Administered 2024-08-29: 500 mg via ORAL

## 2024-08-29 NOTE — Patient Instructions (Signed)

## 2024-08-31 ENCOUNTER — Other Ambulatory Visit: Payer: Self-pay

## 2024-08-31 NOTE — Telephone Encounter (Signed)
 Copied from CRM #[id number]. Topic: Clinical - Medication Refill >> Aug 31, 2024 12:40 PM Hadassah PARAS wrote: Medication: irbesartan (AVAPRO) 150 MG tablet   Has the patient contacted their pharmacy? Yes (Agent: If no, request that the patient contact the pharmacy for the refill. If patient does not wish to contact the pharmacy document the reason why and proceed with request.) (Agent: If yes, when and what did the pharmacy advise?)  This is the patient's preferred pharmacy:  [facility] #[id number] [place], [place] - [address] AT [facility] OF [address] [place] [address] [place] [address] Phone: [phone] Fax: [phone]  Is this the correct pharmacy for this prescription? Yes If no, delete pharmacy and type the correct one.   Has the prescription been filled recently? No  Is the patient out of the medication? No  Has the patient been seen for an appointment in the last year OR does the patient have an upcoming appointment? Yes  Can we respond through MyChart? No  Agent: Please be advised that Rx refills may take up to 3 business days. We ask that you follow-up with your pharmacy.

## 2024-09-03 LAB — PROSTATE CORE NEEDLE BIOPSY

## 2024-09-03 NOTE — Telephone Encounter (Signed)
 Requested medication (s) are due for refill today: yes  Requested medication (s) are on the active medication list: yes  Last refill:  04/06/24  Future visit scheduled: {Yes  Notes to clinic:  historical medication     Requested Prescriptions  Pending Prescriptions Disp Refills   irbesartan  (AVAPRO ) 150 MG tablet      Sig: Take 1 tablet (150 mg total) by mouth daily.     Cardiovascular:  Angiotensin Receptor Blockers Failed - 09/03/2024  4:14 PM      Failed - K in normal range and within 180 days    Potassium  Date Value Ref Range Status  06/12/2024 5.5 (H) 3.5 - 5.3 mmol/L Final         Passed - Cr in normal range and within 180 days    Creat  Date Value Ref Range Status  06/12/2024 0.85 0.70 - 1.28 mg/dL Final         Passed - Patient is not pregnant      Passed - Last BP in normal range    BP Readings from Last 1 Encounters:  07/20/24 (!) 136/57         Passed - Valid encounter within last 6 months    Recent Outpatient Visits           2 months ago PSA elevation   [facility] [place], [place] A, MD   4 months ago Hypertension, unspecified type   [place] Everlene Parris LABOR, MD       Future Appointments             In 3 days [place], Penne SAUNDERS, MD [facility] [place]

## 2024-09-04 MED ORDER — IRBESARTAN 150 MG PO TABS: 150.0000 mg | ORAL_TABLET | Freq: Every day | ORAL | 1 refills | Status: AC

## 2024-09-05 NOTE — Assessment & Plan Note (Addendum)
 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  We reviewed his prior workup, lab data, and biopsy results. Using this information, and comparing against thousands of men who have come before him, with similar diagnostic findings, we are able to risk-stratify his disease and offer the safest and most effective treatment option. AUA risk stratifications include very low risk, low risk, intermediate risk, and high risk disease. He meets criteria for unfavorable intermediate risk . I explained the need for additional staging with unfavorable intermediate risk and high risk groups, including conventional CT scans, bone scans, and newer modalities such as PSMA/PET. I explained that his life expectancy, clinical stage, Gleason score, PSA, and relevant co-morbidities influence our treatment strategies and options. We discussed the roles of active surveillance versus definitive therapies, in the form of radiation or surgery. A treatment should be carefully selected weighing all factors, including patient preference, with a goal to minimize long-term morbidity while preserving cancer-free mortality. For definitive treatments, I reviewed the comparable oncologic outcomes regarding radiation and surgery-- each with their attendant risk profiles and timelines. I emphasized that patients with baseline urinary or lower colonic symptoms may be impacted by treatment strategy, as patients with severe lower urinary tract symptoms may have significant worsening or even develop urinary retention after undergoing radiation.  In regards to surgery, we discussed the robotic radical prostatectomy +/- lymphadenectomy at length. The procedure takes 3 to 4 hours under  general anesthesia, with expected discharge home on post-op day #1.  A Foley catheter is left in place for 7 to 10 days to allow for healing of the vesicourethral anastomosis. There is a small risk of bleeding, infection, damage to surrounding structures or bowel, hernia, DVT/PE, or serious cardiac or pulmonary complications.  We discussed post-op side effects including erectile dysfunction, and the importance of pre-operative erectile function on long-term outcomes.  Even with a nerve sparing approach, there is an approximately 25% rate of permanent erectile dysfunction. We also discussed postop urinary incontinence at length.  We expect patients to have stress incontinence post-operatively that slowly improves over a period of weeks to months. Less than 10% of men will require a pad at 1 year after surgery. Pelvic floor strengthening and rehabilitation is strongly recommended to improve these outcomes. Patients will need to avoid heavy lifting and strenuous activity for 3 to 4 weeks, but most men return to their baseline activity status by 6 weeks.  - PSMA/PET to complete staging- PSA ~17 w/ unfav intermediate risk   - Although I think high likelihood elevated PSA may be secondary to large volume BPH as well - Referral to Radiation oncology for comprehensive treatment options - If localized disease I think it be a reasonable candidate for surgery or radiation.  I will continue our discussion once PSMA PET results, see me back as soon as complete

## 2024-09-05 NOTE — Progress Notes (Signed)
 09/06/2024 1:11 PM   Dakota Dunn 1951-03-09 [id number]  Reason for visit: Follow up prostate biopsy   HPI: 73 y.o. male, follow up with me today  Accompanied by his wife today Recovered well from procedure  S/p MRI Fusion bx (08/29/24) - GS 4+3=7 in 2/13 cores (up to 40%), GS 3+4 in 1/13, GS6 in 2/13   - all Right sided disease  Prior HPI: Reportedly chronic elevated PSA, fluctuating data over 30-40 years He has never seen a urologist, denies prior workup for elevated PSA   PSA 12.94 (Sept 2024)  PSA 12.2 (2022)  MRI - PIRADS 4 at Right lateral apical PZ, 115g gland  MRI Fusion bx (08/29/24) - GS 4+3=7 in 2/13 cores (up to 40%), GS 3+4 in 1/13, GS6 in 2/13    No acute change in urinary habits, denies LUTS Denies personal history of GH, prostatitis, UTIs, nephrolithiasis No family history of prostate cancer Previously worked on sewing machines, now retired and mows a lot of grass    Physical Exam: BP 119/73   Pulse 89   Ht 5' 9 (1.753 m)   Wt 224 lb 3.2 oz (101.7 kg)   BMI 33.11 kg/m    Constitutional:  Alert and oriented, No acute distress.  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  We reviewed his prior workup, lab data, and biopsy results. Using this information, and comparing against thousands of men who have come before him, with similar diagnostic findings, we are able to risk-stratify his disease and offer the safest and most effective treatment option. AUA risk  stratifications include very low risk, low risk, intermediate risk, and high risk disease. He meets criteria for unfavorable intermediate risk . I explained the need for additional staging with unfavorable intermediate risk and high risk groups, including conventional CT scans, bone scans, and newer modalities such as PSMA/PET. I explained that his life expectancy, clinical stage, Gleason score, PSA, and relevant co-morbidities influence our treatment strategies and options. We discussed the roles of active surveillance versus definitive therapies, in the form of radiation or surgery. A treatment should be carefully selected weighing all factors, including patient preference, with a goal to minimize long-term morbidity while preserving cancer-free mortality. For definitive treatments, I reviewed the comparable oncologic outcomes regarding radiation and surgery-- each with their attendant risk profiles and timelines. I emphasized that patients with baseline urinary or lower colonic symptoms may be impacted by treatment strategy, as patients with severe lower urinary tract symptoms may have significant worsening or even develop urinary retention after undergoing radiation.  In regards to surgery, we discussed the robotic radical prostatectomy +/- lymphadenectomy at length. The procedure takes 3 to 4 hours under general anesthesia, with expected discharge home on post-op day #1.  A Foley catheter is left in place for 7 to 10 days to allow for healing of the vesicourethral anastomosis. There is a small risk of bleeding, infection,  damage to surrounding structures or bowel, hernia, DVT/PE, or serious cardiac or pulmonary complications.  We discussed post-op side effects including erectile dysfunction, and the importance of pre-operative erectile function on long-term outcomes.  Even with a nerve sparing approach, there is an approximately 25% rate of permanent erectile dysfunction. We also discussed postop urinary  incontinence at length.  We expect patients to have stress incontinence post-operatively that slowly improves over a period of weeks to months. Less than 10% of men will require a pad at 1 year after surgery. Pelvic floor strengthening and rehabilitation is strongly recommended to improve these outcomes. Patients will need to avoid heavy lifting and strenuous activity for 3 to 4 weeks, but most men return to their baseline activity status by 6 weeks.  - PSMA/PET to complete staging- PSA ~17 w/ unfav intermediate risk   - Although I think high likelihood elevated PSA may be secondary to large volume BPH as well - Referral to Radiation oncology for comprehensive treatment options - If localized disease I think it be a reasonable candidate for surgery or radiation.  I will continue our discussion once PSMA PET results, see me back as soon as complete  Orders: -     Ambulatory referral to Radiation Oncology -     NM PET (PSMA) SKULL TO MID THIGH; Future       Dakota JONELLE Skye, MD  [facility] [address], [address] [place], [place] [address] [phone]

## 2024-09-06 ENCOUNTER — Ambulatory Visit: Admitting: Urology

## 2024-09-06 ENCOUNTER — Encounter: Payer: Self-pay | Admitting: Urology

## 2024-09-06 VITALS — BP 119/73 | HR 89 | Ht 69.0 in | Wt 224.2 lb

## 2024-09-06 DIAGNOSIS — C61 Malignant neoplasm of prostate: Secondary | ICD-10-CM | POA: Insufficient documentation

## 2024-09-06 NOTE — Patient Instructions (Signed)
 Please call [phone] to schedule your imaging prior to your appointment. Please allow time for your imaging results as this can take up 2-3 weeks to review. A follow up appointment as already been scheduled for the doctor to review results with you.

## 2024-09-17 ENCOUNTER — Ambulatory Visit
Admission: RE | Admit: 2024-09-17 | Discharge: 2024-09-17 | Disposition: A | Discharge status: Home / Self Care | Source: Ambulatory Visit | Attending: Urology | Admitting: Urology

## 2024-09-17 ENCOUNTER — Ambulatory Visit: Admitting: Radiation Oncology

## 2024-09-17 DIAGNOSIS — R972 Elevated prostate specific antigen [PSA]: Secondary | ICD-10-CM | POA: Insufficient documentation

## 2024-09-17 DIAGNOSIS — C61 Malignant neoplasm of prostate: Secondary | ICD-10-CM | POA: Insufficient documentation

## 2024-09-17 MED ORDER — FLOTUFOLASTAT F 18 GALLIUM 296-5846 MBQ/ML IV SOLN
8.0000 | Freq: Once | INTRAVENOUS | Status: AC
  Administered 2024-09-17: 8.54 via INTRAVENOUS
  Filled 2024-09-17: qty 8

## 2024-09-25 ENCOUNTER — Ambulatory Visit
Admission: RE | Admit: 2024-09-25 | Discharge: 2024-09-25 | Disposition: A | Discharge status: Home / Self Care | Source: Ambulatory Visit | Attending: Radiation Oncology | Admitting: Radiation Oncology

## 2024-09-25 ENCOUNTER — Encounter: Payer: Self-pay | Admitting: Radiation Oncology

## 2024-09-25 VITALS — BP 136/80 | HR 71 | Temp 97.0°F | Resp 16 | Ht 69.0 in | Wt 220.0 lb

## 2024-09-25 DIAGNOSIS — F17210 Nicotine dependence, cigarettes, uncomplicated: Secondary | ICD-10-CM | POA: Insufficient documentation

## 2024-09-25 DIAGNOSIS — R399 Unspecified symptoms and signs involving the genitourinary system: Secondary | ICD-10-CM | POA: Diagnosis not present

## 2024-09-25 DIAGNOSIS — C61 Malignant neoplasm of prostate: Secondary | ICD-10-CM | POA: Diagnosis present

## 2024-09-25 DIAGNOSIS — Z79899 Other long term (current) drug therapy: Secondary | ICD-10-CM | POA: Insufficient documentation

## 2024-09-25 DIAGNOSIS — I10 Essential (primary) hypertension: Secondary | ICD-10-CM | POA: Diagnosis not present

## 2024-09-25 NOTE — Consult Note (Signed)
 " NEW PATIENT EVALUATION  Name: Dakota Dunn  MRN: [id number]  Date:   09/25/2024     DOB: 06-17-1951   This 73 y.o. 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  REFERRING PHYSICIAN: Georganne Penne SAUNDERS, MD  CHIEF COMPLAINT:  Chief Complaint  Patient presents with   Prostate Cancer    DIAGNOSIS: The encounter diagnosis was Malignant neoplasm of prostate (HCC).   PREVIOUS INVESTIGATIONS:  Prostate MRI and PSMA PET scan reviewed Clinical notes reviewed Pathology report reviewed  HPI: Patient is a 73 year old male who claims he has had an elevated PSA for multiple decades.  His PSA in 24 was 13 has now climbed to 16.  He underwent an MRI of his prostate showing a PI-RADS category 4 lesion of the right anterior right posterior lateral peripheral zone.  His information was set for UroNav targeting.  He underwent 7 core biopsies 4 of which were positive for adenocarcinoma.  2 cores Gleason 7 (4+3) other positive biopsies were Gleason 6 (3+3) and Gleason 7 (3+4).  By MRI there was no evidence of seminal vesicle involvement lymphadenopathy or bone involvement.Patient underwent a PSMA PET scan showing PSMA avid activity in apex of the prostate consistent with known prostate cancer.  No evidence of Adibe avid lymphadenopathy in the pelvis abdomen mediastinum was known noted.  No evidence of skeletal metastasis.  He has been consulted by urology and is not now referred to radiation oncology for opinion.  He has very little lower urinary tract symptoms.  He is having no bone pain or any GI upset.  PLANNED TREATMENT REGIMEN: Image guided IMRT radiation therapy plus ADT therapy  PAST MEDICAL HISTORY:  has a past medical history of Acquired trigger finger (04/09/2024) and Hypertension.    PAST SURGICAL HISTORY:  Past Surgical History:  Procedure Laterality Date   COLONOSCOPY  WITH PROPOFOL  N/A 09/02/2021   Procedure: COLONOSCOPY WITH PROPOFOL ;  Surgeon: Dessa Reyes ORN, MD;  Location: [facility] ENDOSCOPY;  Service: Endoscopy;  Laterality: N/A;    FAMILY HISTORY: family history is not on file.  SOCIAL HISTORY:  reports that he has been smoking cigarettes. He has a 15 pack-year smoking history. He has never used smokeless tobacco. He reports that he does not currently use alcohol. He reports that he does not use drugs.  ALLERGIES: Patient has no known allergies.  MEDICATIONS:  Current Outpatient Medications  Medication Sig Dispense Refill   atorvastatin  (LIPITOR) 20 MG tablet Take 1 tablet (20 mg total) by mouth daily. 90 tablet 3   irbesartan  (AVAPRO ) 150 MG tablet Take 1 tablet (150 mg total) by mouth daily. 90 tablet 1   No current facility-administered medications for this encounter.    ECOG PERFORMANCE STATUS:  0 - Asymptomatic  REVIEW OF SYSTEMS: Patient denies any weight loss, fatigue, weakness, fever, chills or night sweats. Patient denies any loss of vision, blurred vision. Patient denies any ringing  of the ears or hearing loss. No irregular heartbeat. Patient denies heart murmur or history of fainting. Patient denies any chest pain or pain radiating to her upper extremities. Patient denies any shortness of breath, difficulty breathing at night, cough or hemoptysis. Patient denies any swelling in the lower legs. Patient denies any nausea vomiting, vomiting of blood, or coffee ground material in the vomitus. Patient denies any stomach pain. Patient states has had normal bowel movements no significant constipation  or diarrhea. Patient denies any dysuria, hematuria or significant nocturia. Patient denies any problems walking, swelling in the joints or loss of balance. Patient denies any skin changes, loss of hair or loss of weight. Patient denies any excessive worrying or anxiety or significant depression. Patient denies any problems with insomnia. Patient denies  excessive thirst, polyuria, polydipsia. Patient denies any swollen glands, patient denies easy bruising or easy bleeding. Patient denies any recent infections, allergies or URI. Patient s visual fields have not changed significantly in recent time.   PHYSICAL EXAM: BP 136/80   Pulse 71   Temp (!) 97 F (36.1 C)   Resp 16   Ht 5' 9 (1.753 m)   Wt 220 lb (99.8 kg)   BMI 32.49 kg/m  Well-developed well-nourished patient in NAD. HEENT reveals PERLA, EOMI, discs not visualized.  Oral cavity is clear. No oral mucosal lesions are identified. Neck is clear without evidence of cervical or supraclavicular adenopathy. Lungs are clear to A&P. Cardiac examination is essentially unremarkable with regular rate and rhythm without murmur rub or thrill. Abdomen is benign with no organomegaly or masses noted. Motor sensory and DTR levels are equal and symmetric in the upper and lower extremities. Cranial nerves II through XII are grossly intact. Proprioception is intact. No peripheral adenopathy or edema is identified. No motor or sensory levels are noted. Crude visual fields are within normal range.  LABORATORY DATA: Pathology reports reviewed compatible with above-stated findings    RADIOLOGY RESULTS: MRI scan of prostate as well as PSMA PET scan reviewed compatible with above stated findings.   IMPRESSION: Stage IIc Gleason 7 (4+3) adenocarcinoma the prostate presenting with a PSA in the 16 range in 73 year old male  PLAN: At this time I have run the [facility] nomogram showing a possibility of over 70% chance of extracapsular extension as well as approximately 25% chance of lymph node and involvement.  Based on his PSMA PET scan findings as well as MRI scan would not treat his pelvic lymph nodes and would concentrate image guided radiation therapy on his prostate.  I would plan on delivering 80 Gray over 8 weeks boosting the area of PSMA PET avid activity in his prostate to 27 Gray using IMRT  treatment planning and delivery.  Risks and benefits of treatment occluding increased lower Neri tract symptoms diarrhea fatigue alteration blood counts skin reaction all were reviewed in detail with the patient.  I have asked urology to place fiducial markers for daily image guided treatment as well as start the patient on one 70-month Eligard depot.  Patient comprehends my recommendations well.  Simulation will be arranged after markers are placed.  I would like to take this opportunity to thank you for allowing me to participate in the care of your patient.SABRA Marcey Penton, MD         "

## 2024-09-26 ENCOUNTER — Telehealth: Payer: Self-pay

## 2024-09-26 NOTE — Telephone Encounter (Signed)
 Contacted pt to schedule marker and Eligard  appt.  Scheduled for 10/12/24.   Noticed pt has a 1 month f/u to review pet scan on 10/08/24. Could this be discussed on 1/16?   Pt aware BRG will get this message on 09/28/24. Will contact him once I hear from BRG.   Pls advise.

## 2024-09-28 NOTE — Telephone Encounter (Signed)
 10/08/24 appt cx.   Pt aware.

## 2024-10-02 NOTE — Progress Notes (Signed)
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 A time out was performed to ensure correct patient identity.  - Gentamicin  and levaquin  given for antibiotic prophylaxis  Procedure: - Periprostatic nerve block performed using 10 cc 1% lidocaine   - No significant hypoechoic lesions or median lobe noted  Gold fiducial markers were placed via ultrasound guidance at the Right apex, Right base and Left base - total 3 markers placed.   Additionally, 45 mg Eligard  ADT injection was delivered today.  Post-Procedure: - Patient tolerated the procedure well - He was counseled to seek immediate medical attention if experiences significant bleeding, fevers, urinary retention, or severe pain - Return in one week to discuss biopsy results  Assessment/ Plan: Follow up with Radiation oncology for IMRT  Penne Skye, MD 10/02/2024   "

## 2024-10-04 ENCOUNTER — Telehealth: Payer: Self-pay

## 2024-10-04 NOTE — Telephone Encounter (Signed)
 Auth Submission: APPROVED Site of care: Urology Payer: [facility] medicare Medication & CPT/J Code(s) submitted: Eligard  [id number] Diagnosis Code:  Route of submission (phone, fax, portal): portal Phone # Fax # Auth type: Buy/Bill PB Units/visits requested: 45mg  x 2 doses Reference number: [id number] Approval from: 10/04/24 to 10/04/25

## 2024-10-08 ENCOUNTER — Ambulatory Visit: Admitting: Urology

## 2024-10-12 ENCOUNTER — Encounter: Payer: Self-pay | Admitting: Urology

## 2024-10-12 ENCOUNTER — Ambulatory Visit: Admitting: Urology

## 2024-10-12 VITALS — BP 101/52 | HR 64 | Ht 69.0 in | Wt 219.4 lb

## 2024-10-12 DIAGNOSIS — C61 Malignant neoplasm of prostate: Secondary | ICD-10-CM

## 2024-10-12 DIAGNOSIS — Z2989 Encounter for other specified prophylactic measures: Secondary | ICD-10-CM | POA: Diagnosis not present

## 2024-10-12 MED ORDER — GENTAMICIN SULFATE 40 MG/ML IJ SOLN
80.0000 mg | Freq: Once | INTRAMUSCULAR | Status: AC
  Administered 2024-10-12: 80 mg via INTRAMUSCULAR

## 2024-10-12 MED ORDER — LEVOFLOXACIN 500 MG PO TABS
500.0000 mg | ORAL_TABLET | Freq: Once | ORAL | Status: AC
  Administered 2024-10-12: 500 mg via ORAL

## 2024-10-12 MED ORDER — LEUPROLIDE ACETATE (6 MONTH) 45 MG ~~LOC~~ KIT
45.0000 mg | PACK | Freq: Once | SUBCUTANEOUS | Status: AC
  Administered 2024-10-12: 45 mg via SUBCUTANEOUS

## 2024-10-12 NOTE — Addendum Note (Signed)
 Addended by: Tyiana Hill E on: 10/12/2024 10:09 AM   Modules accepted: Orders

## 2024-10-17 ENCOUNTER — Ambulatory Visit
Admission: RE | Admit: 2024-10-17 | Discharge: 2024-10-17 | Disposition: A | Discharge status: Home / Self Care | Source: Ambulatory Visit | Attending: Radiation Oncology | Admitting: Radiation Oncology

## 2024-10-17 DIAGNOSIS — C61 Malignant neoplasm of prostate: Secondary | ICD-10-CM | POA: Diagnosis present

## 2024-10-18 ENCOUNTER — Other Ambulatory Visit: Payer: Self-pay | Admitting: *Deleted

## 2024-10-18 DIAGNOSIS — C61 Malignant neoplasm of prostate: Secondary | ICD-10-CM

## 2024-10-24 DIAGNOSIS — C61 Malignant neoplasm of prostate: Secondary | ICD-10-CM | POA: Diagnosis not present

## 2024-10-25 ENCOUNTER — Ambulatory Visit

## 2024-10-26 ENCOUNTER — Ambulatory Visit
Admission: RE | Admit: 2024-10-26 | Discharge: 2024-10-26 | Disposition: A | Source: Ambulatory Visit | Attending: Radiation Oncology | Admitting: Radiation Oncology

## 2024-10-29 ENCOUNTER — Ambulatory Visit

## 2024-10-30 ENCOUNTER — Ambulatory Visit
Admission: RE | Admit: 2024-10-30 | Discharge: 2024-10-30 | Attending: Radiation Oncology | Admitting: Radiation Oncology

## 2024-10-30 ENCOUNTER — Ambulatory Visit

## 2024-10-30 ENCOUNTER — Other Ambulatory Visit: Payer: Self-pay

## 2024-10-30 LAB — RAD ONC ARIA SESSION SUMMARY
Course Elapsed Days: 0
Plan Fractions Treated to Date: 1
Plan Prescribed Dose Per Fraction: 2.5 Gy
Plan Total Fractions Prescribed: 28
Plan Total Prescribed Dose: 70 Gy
Reference Point Dosage Given to Date: 2.5 Gy
Reference Point Session Dosage Given: 2.5 Gy
Session Number: 1

## 2024-10-31 ENCOUNTER — Ambulatory Visit
Admission: RE | Admit: 2024-10-31 | Discharge: 2024-10-31 | Disposition: A | Source: Ambulatory Visit | Attending: Radiation Oncology | Admitting: Radiation Oncology

## 2024-10-31 ENCOUNTER — Other Ambulatory Visit: Payer: Self-pay

## 2024-10-31 LAB — RAD ONC ARIA SESSION SUMMARY
Course Elapsed Days: 1
Plan Fractions Treated to Date: 2
Plan Prescribed Dose Per Fraction: 2.5 Gy
Plan Total Fractions Prescribed: 28
Plan Total Prescribed Dose: 70 Gy
Reference Point Dosage Given to Date: 5 Gy
Reference Point Session Dosage Given: 2.5 Gy
Session Number: 2

## 2024-11-01 ENCOUNTER — Inpatient Hospital Stay

## 2024-11-01 ENCOUNTER — Other Ambulatory Visit: Payer: Self-pay

## 2024-11-01 ENCOUNTER — Ambulatory Visit
Admission: RE | Admit: 2024-11-01 | Discharge: 2024-11-01 | Disposition: A | Source: Ambulatory Visit | Attending: Radiation Oncology | Admitting: Radiation Oncology

## 2024-11-01 DIAGNOSIS — C61 Malignant neoplasm of prostate: Secondary | ICD-10-CM

## 2024-11-01 LAB — RAD ONC ARIA SESSION SUMMARY
Course Elapsed Days: 2
Plan Fractions Treated to Date: 3
Plan Prescribed Dose Per Fraction: 2.5 Gy
Plan Total Fractions Prescribed: 28
Plan Total Prescribed Dose: 70 Gy
Reference Point Dosage Given to Date: 7.5 Gy
Reference Point Session Dosage Given: 2.5 Gy
Session Number: 3

## 2024-11-01 LAB — CBC (CANCER CENTER ONLY)
HCT: 47.6 % (ref 39.0–52.0)
Hemoglobin: 15.4 g/dL (ref 13.0–17.0)
MCH: 29.5 pg (ref 26.0–34.0)
MCHC: 32.4 g/dL (ref 30.0–36.0)
MCV: 91.2 fL (ref 80.0–100.0)
Platelet Count: 288 10*3/uL (ref 150–400)
RBC: 5.22 MIL/uL (ref 4.22–5.81)
RDW: 12.8 % (ref 11.5–15.5)
WBC Count: 8.1 10*3/uL (ref 4.0–10.5)
nRBC: 0 % (ref 0.0–0.2)

## 2024-11-02 ENCOUNTER — Other Ambulatory Visit: Payer: Self-pay

## 2024-11-02 ENCOUNTER — Ambulatory Visit: Admission: RE | Admit: 2024-11-02 | Source: Ambulatory Visit

## 2024-11-02 LAB — RAD ONC ARIA SESSION SUMMARY
Course Elapsed Days: 3
Plan Fractions Treated to Date: 4
Plan Prescribed Dose Per Fraction: 2.5 Gy
Plan Total Fractions Prescribed: 28
Plan Total Prescribed Dose: 70 Gy
Reference Point Dosage Given to Date: 10 Gy
Reference Point Session Dosage Given: 2.5 Gy
Session Number: 4

## 2024-11-05 ENCOUNTER — Ambulatory Visit

## 2024-11-06 ENCOUNTER — Ambulatory Visit

## 2024-11-07 ENCOUNTER — Ambulatory Visit

## 2024-11-08 ENCOUNTER — Ambulatory Visit

## 2024-11-09 ENCOUNTER — Ambulatory Visit

## 2024-11-12 ENCOUNTER — Ambulatory Visit

## 2024-11-13 ENCOUNTER — Ambulatory Visit

## 2024-11-14 ENCOUNTER — Ambulatory Visit

## 2024-11-15 ENCOUNTER — Ambulatory Visit

## 2024-11-15 ENCOUNTER — Inpatient Hospital Stay

## 2024-11-16 ENCOUNTER — Ambulatory Visit

## 2024-11-19 ENCOUNTER — Ambulatory Visit

## 2024-11-20 ENCOUNTER — Ambulatory Visit

## 2024-11-21 ENCOUNTER — Ambulatory Visit

## 2024-11-22 ENCOUNTER — Ambulatory Visit

## 2024-11-23 ENCOUNTER — Ambulatory Visit

## 2024-11-26 ENCOUNTER — Ambulatory Visit

## 2024-11-27 ENCOUNTER — Ambulatory Visit

## 2024-11-28 ENCOUNTER — Ambulatory Visit

## 2024-11-29 ENCOUNTER — Ambulatory Visit

## 2024-11-29 ENCOUNTER — Inpatient Hospital Stay

## 2024-11-30 ENCOUNTER — Ambulatory Visit

## 2024-12-03 ENCOUNTER — Ambulatory Visit

## 2024-12-04 ENCOUNTER — Ambulatory Visit

## 2024-12-05 ENCOUNTER — Ambulatory Visit

## 2024-12-06 ENCOUNTER — Ambulatory Visit

## 2024-12-10 ENCOUNTER — Other Ambulatory Visit

## 2024-12-13 ENCOUNTER — Encounter: Admitting: Family Medicine

## 2024-12-17 ENCOUNTER — Ambulatory Visit

## 2025-04-23 ENCOUNTER — Encounter: Admitting: Family Medicine

## 2025-04-30 ENCOUNTER — Encounter: Admitting: Family Medicine
